# Patient Record
Sex: Female | Born: 1985 | Race: White | Hispanic: No | Marital: Married | Smoking: Former smoker
Health system: Southern US, Community
[De-identification: ages and names within clinical notes are randomized; demographics above are authoritative.]

## PROBLEM LIST (undated history)

## (undated) DIAGNOSIS — R569 Unspecified convulsions: Secondary | ICD-10-CM

## (undated) DIAGNOSIS — M1990 Unspecified osteoarthritis, unspecified site: Secondary | ICD-10-CM

## (undated) HISTORY — DX: Unspecified osteoarthritis, unspecified site: M19.90

## (undated) HISTORY — PX: APPENDECTOMY: SHX54

---

## 2010-04-06 ENCOUNTER — Ambulatory Visit (HOSPITAL_COMMUNITY): Payer: Self-pay | Admitting: Psychiatry

## 2010-05-12 ENCOUNTER — Ambulatory Visit (HOSPITAL_COMMUNITY)
Admission: RE | Admit: 2010-05-12 | Discharge: 2010-05-12 | Payer: Self-pay | Source: Home / Self Care | Attending: Psychiatry | Admitting: Psychiatry

## 2010-05-28 ENCOUNTER — Ambulatory Visit (HOSPITAL_COMMUNITY): Admit: 2010-05-28 | Payer: Self-pay | Admitting: Psychiatry

## 2010-05-28 ENCOUNTER — Encounter (INDEPENDENT_AMBULATORY_CARE_PROVIDER_SITE_OTHER): Payer: Medicaid Other | Admitting: Behavioral Health

## 2010-05-28 DIAGNOSIS — F411 Generalized anxiety disorder: Secondary | ICD-10-CM

## 2010-06-18 ENCOUNTER — Encounter (INDEPENDENT_AMBULATORY_CARE_PROVIDER_SITE_OTHER): Payer: Medicaid Other | Admitting: Behavioral Health

## 2010-06-18 DIAGNOSIS — F411 Generalized anxiety disorder: Secondary | ICD-10-CM

## 2010-07-02 ENCOUNTER — Encounter (HOSPITAL_COMMUNITY): Payer: Medicaid Other | Admitting: Behavioral Health

## 2010-07-13 ENCOUNTER — Encounter (HOSPITAL_COMMUNITY): Payer: Self-pay | Admitting: Behavioral Health

## 2010-07-23 ENCOUNTER — Encounter (HOSPITAL_COMMUNITY): Payer: Self-pay | Admitting: Behavioral Health

## 2010-11-17 ENCOUNTER — Emergency Department (HOSPITAL_COMMUNITY)
Admission: EM | Admit: 2010-11-17 | Discharge: 2010-11-18 | Disposition: A | Discharge status: Home / Self Care | Payer: Medicaid Other | Attending: Emergency Medicine | Admitting: Emergency Medicine

## 2010-11-17 ENCOUNTER — Emergency Department (HOSPITAL_COMMUNITY): Payer: Medicaid Other

## 2010-11-17 DIAGNOSIS — R51 Headache: Secondary | ICD-10-CM | POA: Insufficient documentation

## 2010-11-17 DIAGNOSIS — H53149 Visual discomfort, unspecified: Secondary | ICD-10-CM | POA: Insufficient documentation

## 2010-11-17 DIAGNOSIS — J45909 Unspecified asthma, uncomplicated: Secondary | ICD-10-CM | POA: Insufficient documentation

## 2011-06-12 ENCOUNTER — Emergency Department (HOSPITAL_COMMUNITY)
Admission: EM | Admit: 2011-06-12 | Discharge: 2011-06-12 | Disposition: A | Discharge status: Home / Self Care | Payer: Medicaid Other | Attending: Emergency Medicine | Admitting: Emergency Medicine

## 2011-06-12 ENCOUNTER — Encounter (HOSPITAL_COMMUNITY): Payer: Self-pay | Admitting: Emergency Medicine

## 2011-06-12 DIAGNOSIS — R404 Transient alteration of awareness: Secondary | ICD-10-CM | POA: Insufficient documentation

## 2011-06-12 DIAGNOSIS — F17200 Nicotine dependence, unspecified, uncomplicated: Secondary | ICD-10-CM | POA: Insufficient documentation

## 2011-06-12 DIAGNOSIS — Z79899 Other long term (current) drug therapy: Secondary | ICD-10-CM | POA: Insufficient documentation

## 2011-06-12 DIAGNOSIS — R55 Syncope and collapse: Secondary | ICD-10-CM | POA: Insufficient documentation

## 2011-06-12 DIAGNOSIS — R51 Headache: Secondary | ICD-10-CM | POA: Insufficient documentation

## 2011-06-12 HISTORY — DX: Unspecified convulsions: R56.9

## 2011-06-12 LAB — URINALYSIS, ROUTINE W REFLEX MICROSCOPIC
Bilirubin Urine: NEGATIVE
Ketones, ur: NEGATIVE mg/dL
Leukocytes, UA: NEGATIVE
Nitrite: NEGATIVE
Protein, ur: NEGATIVE mg/dL
Urobilinogen, UA: 0.2 mg/dL (ref 0.0–1.0)
pH: 7.5 (ref 5.0–8.0)

## 2011-06-12 LAB — CBC
MCH: 29.2 pg (ref 26.0–34.0)
MCHC: 33.7 g/dL (ref 30.0–36.0)
Platelets: 261 10*3/uL (ref 150–400)
RDW: 13 % (ref 11.5–15.5)

## 2011-06-12 LAB — POCT I-STAT, CHEM 8
Calcium, Ion: 1.2 mmol/L (ref 1.12–1.32)
HCT: 39 % (ref 36.0–46.0)
Hemoglobin: 13.3 g/dL (ref 12.0–15.0)
TCO2: 22 mmol/L (ref 0–100)

## 2011-06-12 LAB — ETHANOL: Alcohol, Ethyl (B): 53 mg/dL — ABNORMAL HIGH (ref 0–11)

## 2011-06-12 MED ORDER — METOCLOPRAMIDE HCL 5 MG/ML IJ SOLN
10.0000 mg | Freq: Once | INTRAMUSCULAR | Status: AC
  Administered 2011-06-12: 10 mg via INTRAVENOUS
  Filled 2011-06-12: qty 2

## 2011-06-12 MED ORDER — SODIUM CHLORIDE 0.9 % IV BOLUS (SEPSIS)
1000.0000 mL | Freq: Once | INTRAVENOUS | Status: AC
  Administered 2011-06-12: 1000 mL via INTRAVENOUS

## 2011-06-12 MED ORDER — DIPHENHYDRAMINE HCL 50 MG/ML IJ SOLN
12.5000 mg | Freq: Once | INTRAMUSCULAR | Status: AC
  Administered 2011-06-12: 12.5 mg via INTRAVENOUS
  Filled 2011-06-12: qty 1

## 2011-06-12 MED ORDER — POTASSIUM CHLORIDE CRYS ER 20 MEQ PO TBCR
40.0000 meq | EXTENDED_RELEASE_TABLET | Freq: Once | ORAL | Status: AC
  Administered 2011-06-12: 40 meq via ORAL
  Filled 2011-06-12: qty 2

## 2011-06-12 MED ORDER — DEXAMETHASONE SODIUM PHOSPHATE 4 MG/ML IJ SOLN
10.0000 mg | Freq: Once | INTRAMUSCULAR | Status: AC
  Administered 2011-06-12: 10 mg via INTRAVENOUS
  Filled 2011-06-12: qty 3

## 2011-06-12 NOTE — ED Notes (Signed)
Patient ambulated in hallway with steady gait.

## 2011-06-12 NOTE — Discharge Instructions (Signed)
Rest and be sure to drink plenty of fluids. Hold Topamax until you're able to be reevaluated by your physician.  Syncope  You have had a fainting (syncopal) spell. A fainting episode is a sudden, short-lived loss of consciousness. It results in complete recovery. It occurs because there has been a temporary shortage of oxygen and/or sugar (glucose) to the brain.  CAUSES  Blood pressure pills and other medications that may lower blood pressure below normal. Sudden changes in posture (sudden standing).  Over-medication. Take your medications as directed.  Standing too long. This can cause blood to pool in the legs.  Seizure disorders.  Low blood sugar (hypoglycemia) of diabetes. This more commonly causes coma.  Bearing down to go to the bathroom. This can cause your blood pressure to rise suddenly. Your body compensates by making the blood pressure too low when you stop bearing down.  Hardening of the arteries where the brain temporarily does not receive enough blood.  Irregular heart beat and circulatory problems.  Fear, emotional distress, injury, sight of blood, or illness.  Your caregiver will send you home if the syncope was from non-worrisome causes (benign). Depending on your age and health, you may stay to be monitored and observed. If you return home, have someone stay with you if your caregiver feels that is desirable.  It is very important to keep all follow-up referrals and appointments in order to properly manage this condition. This is a serious problem which can lead to serious illness and death if not carefully managed.  WARNING: Do not drive or operate machinery until your caregiver feels that it is safe for you to do so.  SEEK IMMEDIATE MEDICAL CARE IF:  You have another fainting episode or faint while lying or sitting down. DO NOT DRIVE YOURSELF. Call 911 if no other help is available.  You have chest pain, are feeling sick to your stomach (nausea), vomiting or abdominal pain.    You have an irregular heartbeat or one that is very fast (pulse over 120 beats per minute).  You have a loss of feeling in some part of your body or lose movement in your arms or legs.  You have difficulty with speech, confusion, severe weakness, or visual problems.  You become sweaty and/or feel light headed.  Make sure you are rechecked as instructed.

## 2011-06-12 NOTE — ED Provider Notes (Signed)
History     CSN: [id number]  Arrival date & time 06/12/11  0008   First MD Initiated Contact with Patient 06/12/11 0013      Chief Complaint  Patient presents with  . Loss of Consciousness    patient was at the bar with friends. she hat a shot of alcohol and she passed out there. EMS reported that boyfriend said that patient was out for a minute.     (Consider location/radiation/quality/duration/timing/severity/associated sxs/prior treatment) The history is provided by the patient.   syncopal event occurred prior to arrival. Patient was with her boyfriend at a bar and had been drinking when he noticed that she did not look right, and he caught her as she passed out. She did not hit the floor. He took her outside the club and 911 was called and she came to. Patient states she has a history of migraines seizures diagnosed sometime ago. She believes this was a typical episode for her. She denies any abdominal pain, vaginal bleeding or discharge. No Recent illness. She has not been getting a lot of sleep recently and did get up early this morning and is staying out late tonight. She typically does not drink alcohol. No chest pain or shortness of breath. No tongue biting or incontinence. Significant other denies any shaking or seizure activity otherwise. Patient admits to being under a lot of increased stress recently. She has also started a new medication Topamax, prescribed by Dr. Joycelyn Man. She currently denies feeling near syncopal. Moderate in severity. She does have a mild to moderate headache and is requesting something for what feels like a migraine. Pain all over and throbbing in quality.  Past Medical History  Diagnosis Date  . Seizures   . Migraine     History reviewed. No pertinent past surgical history.  History reviewed. No pertinent family history.  History  Substance Use Topics  . Smoking status: Current Everyday Smoker  . Smokeless tobacco: Not on file  . Alcohol Use: Yes      occasionally    OB History    Grav Para Term Preterm Abortions TAB SAB Ect Mult Living                  Review of Systems  Constitutional: Negative for fever and chills.  HENT: Negative for neck pain and neck stiffness.   Eyes: Negative for pain.  Respiratory: Negative for shortness of breath.   Cardiovascular: Negative for chest pain.  Gastrointestinal: Negative for abdominal pain.  Genitourinary: Negative for dysuria.  Musculoskeletal: Negative for back pain.  Skin: Negative for rash.  Neurological: Positive for headaches.  All other systems reviewed and are negative.    Allergies  Review of patient's allergies indicates no known allergies.  Home Medications   Current Outpatient Rx  Name Route Sig Dispense Refill  . METOPROLOL SUCCINATE ER 50 MG PO TB24 Oral Take 25 mg by mouth at bedtime. Take with or immediately following a meal.    . TIZANIDINE HCL 4 MG PO TABS Oral Take 2-4 mg by mouth at bedtime.    . TOPIRAMATE 25 MG PO TABS Oral Take 25 mg by mouth at bedtime.      BP 109/70  Pulse 70  Temp(Src) 98.6 F (37 C) (Oral)  Resp 15  Ht 5\' 7"  (1.702 m)  Wt 104 lb (47.174 kg)  BMI 16.29 kg/m2  SpO2 99%  LMP 06/08/2011  Physical Exam  Constitutional: She is oriented to person, place, and time. She  appears well-developed and well-nourished.  HENT:  Head: Normocephalic and atraumatic.       No tongue trauma  Eyes: Conjunctivae and EOM are normal. Pupils are equal, round, and reactive to light.  Neck: Full passive range of motion without pain. Neck supple. No thyromegaly present.       No meningismus  Cardiovascular: Normal rate, regular rhythm, S1 normal, S2 normal and intact distal pulses.   Pulmonary/Chest: Effort normal and breath sounds normal.  Abdominal: Soft. Bowel sounds are normal. There is no tenderness. There is no CVA tenderness.  Musculoskeletal: Normal range of motion.  Neurological: She is alert and oriented to person, place, and time. She  has normal strength and normal reflexes. No cranial nerve deficit or sensory deficit. She displays a negative Romberg sign. GCS eye subscore is 4. GCS verbal subscore is 5. GCS motor subscore is 6.       Normal Gait  Skin: Skin is warm and dry. No rash noted. No cyanosis. Nails show no clubbing.  Psychiatric: She has a normal mood and affect. Her speech is normal and behavior is normal.    ED Course  Procedures (including critical care time)  Labs Reviewed  URINALYSIS, ROUTINE W REFLEX MICROSCOPIC - Abnormal; Notable for the following:    Color, Urine STRAW (*)    Specific Gravity, Urine 1.004 (*)    All other components within normal limits  ETHANOL - Abnormal; Notable for the following:    Alcohol, Ethyl (B) 53 (*)    All other components within normal limits  POCT I-STAT, CHEM 8 - Abnormal; Notable for the following:    Potassium 3.3 (*)    Glucose, Bld 100 (*)    All other components within normal limits  PREGNANCY, URINE  CBC     Date: 06/12/2011  Rate: 71  Rhythm: normal sinus rhythm  QRS Axis: normal  Intervals: normal  ST/T Wave abnormalities: nonspecific ST/T changes  Conduction Disutrbances:none  Narrative Interpretation:   Old EKG Reviewed: none available   MDM   Syncope versus seizure. Workup as above. Potassium given for hypokalemia. No bradycardia or hypotension to suggest adverse medication reaction from Topamax. No obvious seizure activity reported. Patient given IV fluids and observed. On recheck at 2:40 AM is feeling much better and requesting to go home. Headache improving. Repeat exam remains unchanged no neuro deficits. Plan close primary care followup for review of medications and followup as an outpatient. Reliable historian states understanding strict return precautions.        Sunnie Nielsen, MD 06/12/11 [phone]

## 2012-05-01 IMAGING — CT CT HEAD W/O CM
1 of 2 series · 13 of 30 positions shown, 17 images · non-contrast
Comparison: None.

CLINICAL DATA: Headaches

CT HEAD WITHOUT CONTRAST
TECHNIQUE: Contiguous axial images were obtained from the base of
the skull through the vertex without contrast.

[Series 2: brain · axial · 0.49mm/px · z∈[+135,+266]mm · 13 of 32 slices shown, 17 images]
[im 3/32  brain]
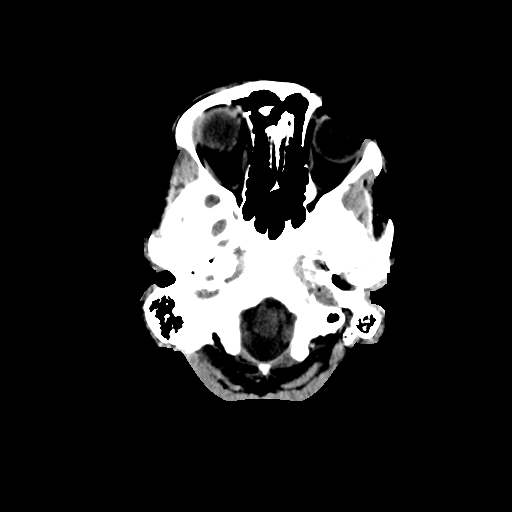
[im 3/32  bone]
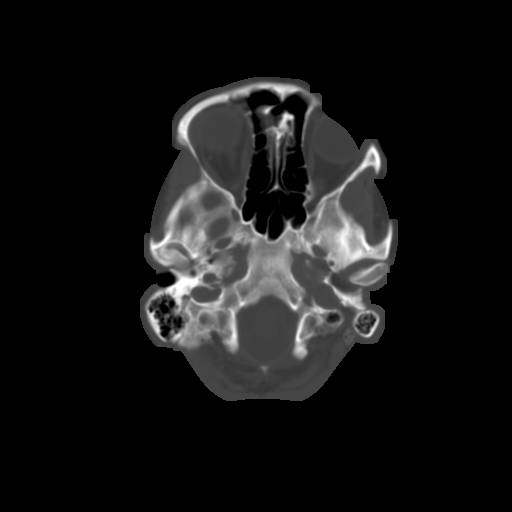
[im 5/32  brain]
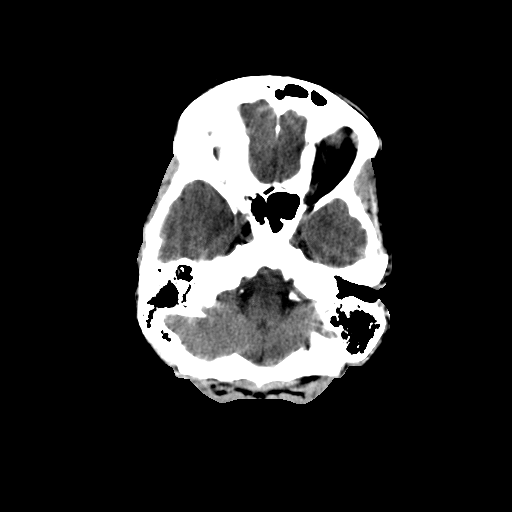
[im 7/32  brain]
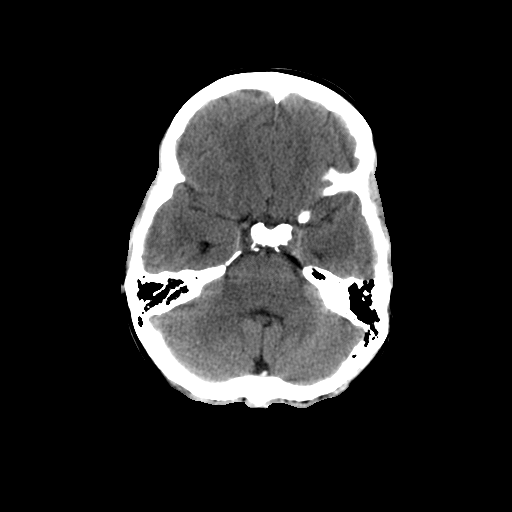
[im 9/32  brain]
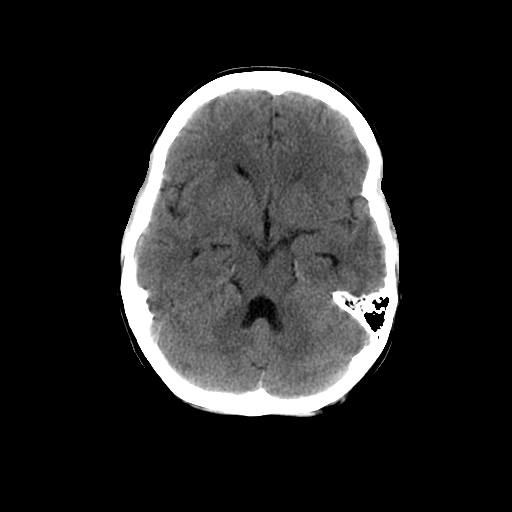
[im 12/32  brain]
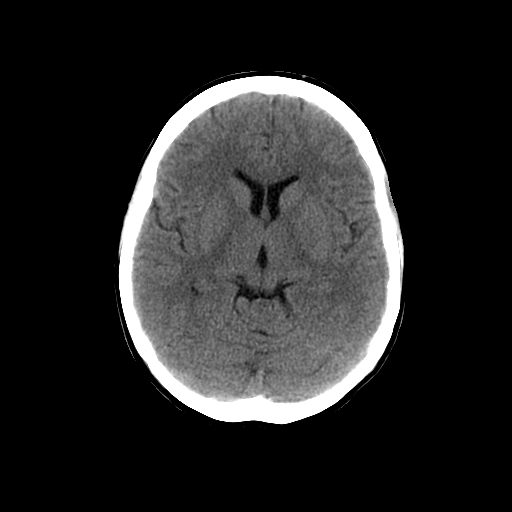
[im 12/32  bone]
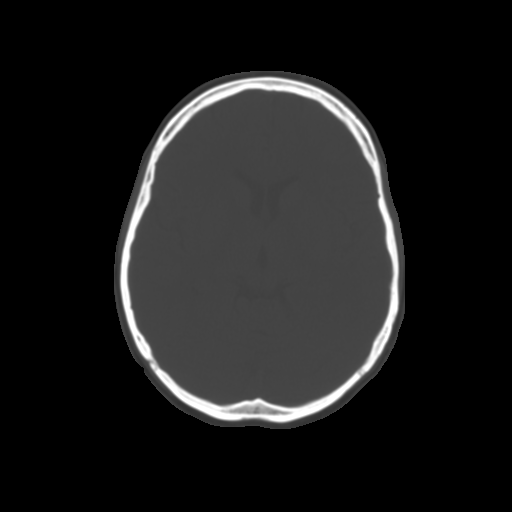
[im 14/32  brain]
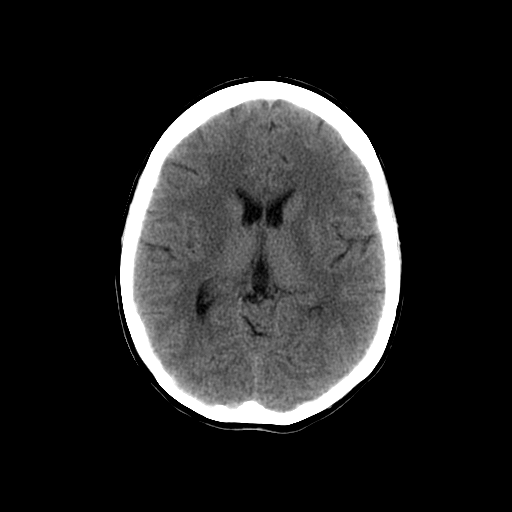
[im 16/32  brain]
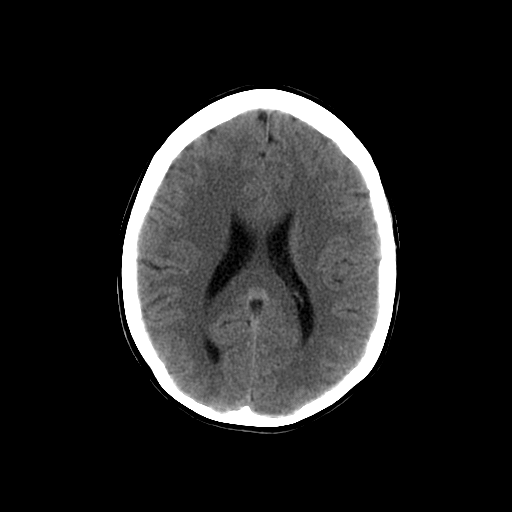
[im 18/32  brain]
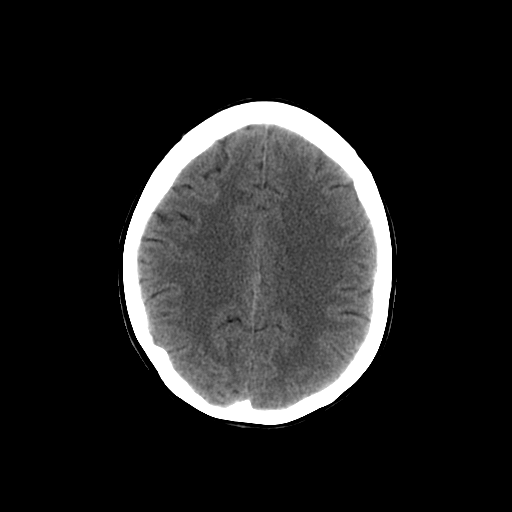
[im 20/32  brain]
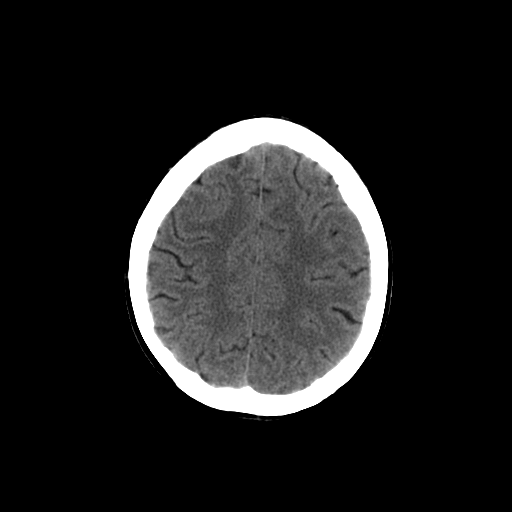
[im 20/32  bone]
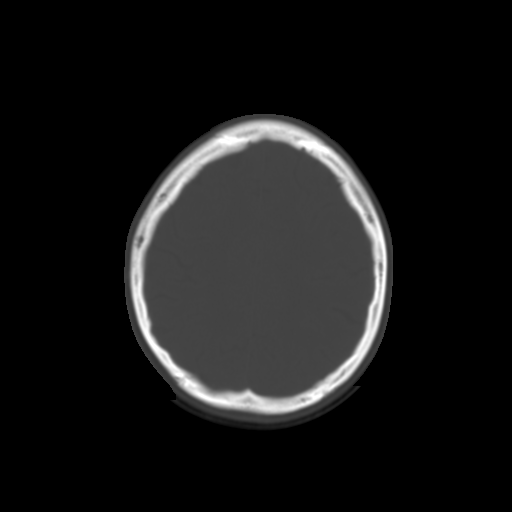
[im 23/32  brain]
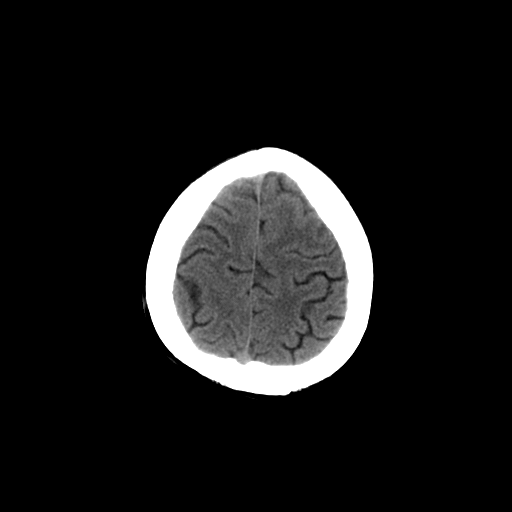
[im 25/32  brain]
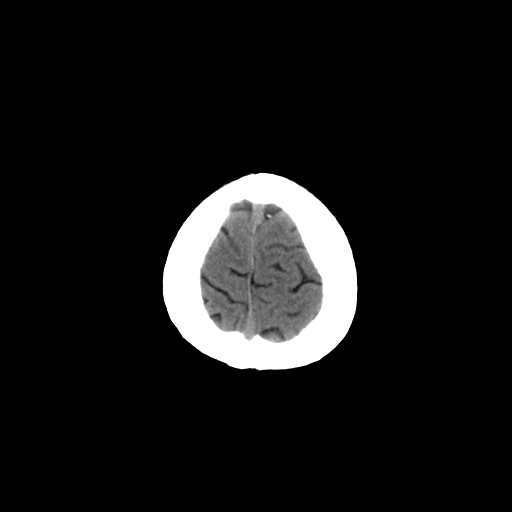
[im 27/32  brain]
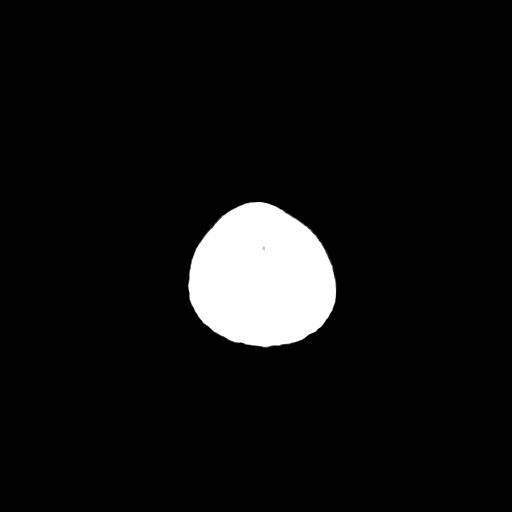
[im 29/32  brain]
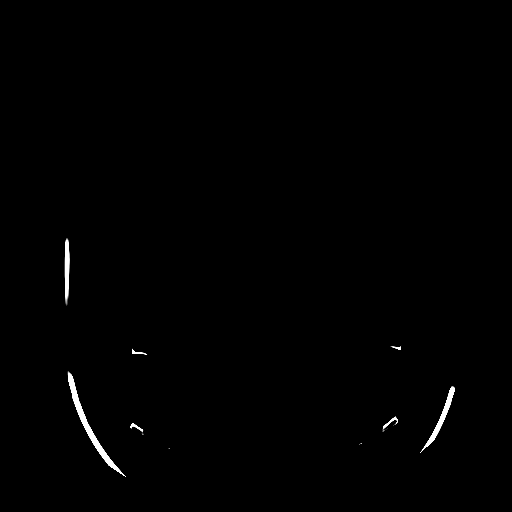
[im 29/32  bone]
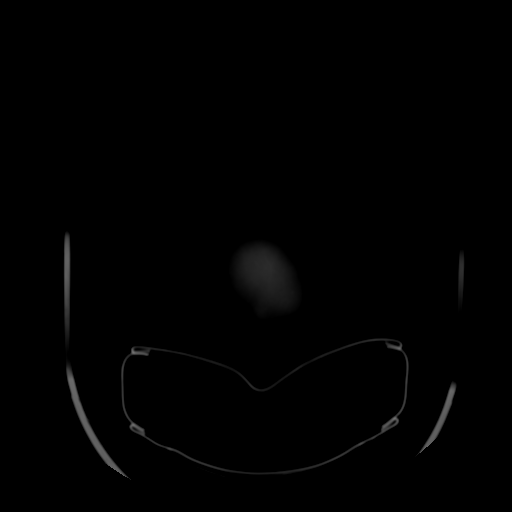

[13 of 30 positions shown; findings below may reference images not displayed]

FINDINGS: There is no evidence of acute intracranial hemorrhage,
brain edema, mass lesion, acute infarction,   mass effect, or
midline shift. Acute infarct may be inapparent on noncontrast CT.
No other intra-axial abnormalities are seen, and the ventricles and
sulci are within normal limits in size and symmetry.   No abnormal
extra-axial fluid collections or masses are identified.  No
significant calvarial abnormality.
IMPRESSION: 1. Negative for bleed or other acute intracranial process.

## 2014-04-26 HISTORY — PX: TUBAL LIGATION: SHX77

## 2016-09-19 ENCOUNTER — Encounter (HOSPITAL_COMMUNITY): Payer: Self-pay

## 2016-09-19 ENCOUNTER — Emergency Department (HOSPITAL_COMMUNITY)
Admission: EM | Admit: 2016-09-19 | Discharge: 2016-09-19 | Disposition: A | Discharge status: Home / Self Care | Payer: Medicaid Other | Attending: Emergency Medicine | Admitting: Emergency Medicine

## 2016-09-19 DIAGNOSIS — R55 Syncope and collapse: Secondary | ICD-10-CM | POA: Diagnosis present

## 2016-09-19 DIAGNOSIS — F17200 Nicotine dependence, unspecified, uncomplicated: Secondary | ICD-10-CM | POA: Diagnosis not present

## 2016-09-19 DIAGNOSIS — Z79899 Other long term (current) drug therapy: Secondary | ICD-10-CM | POA: Diagnosis not present

## 2016-09-19 LAB — I-STAT CHEM 8, ED
BUN: 5 mg/dL — ABNORMAL LOW (ref 6–20)
CALCIUM ION: 1.21 mmol/L (ref 1.15–1.40)
Chloride: 102 mmol/L (ref 101–111)
Creatinine, Ser: 0.7 mg/dL (ref 0.44–1.00)
Glucose, Bld: 125 mg/dL — ABNORMAL HIGH (ref 65–99)
HEMATOCRIT: 27 % — AB (ref 36.0–46.0)
HEMOGLOBIN: 9.2 g/dL — AB (ref 12.0–15.0)
Potassium: 3.3 mmol/L — ABNORMAL LOW (ref 3.5–5.1)
Sodium: 141 mmol/L (ref 135–145)
TCO2: 27 mmol/L (ref 0–100)

## 2016-09-19 LAB — I-STAT BETA HCG BLOOD, ED (MC, WL, AP ONLY)

## 2016-09-19 NOTE — ED Triage Notes (Signed)
She became near-syncopal while at a local restaurant/bar. Her boy friend noticed her about to pass out, when he caught her up in his arms, thereby preventing her from falling. EMS found her to be awake, alert and oriented. Uopn them standing pt. For orthostatics, she told them she again felt "like passing out". EKG performed by EMS [facility].

## 2016-09-19 NOTE — ED Notes (Signed)
Bed: WA14 Expected date:  Expected time:  Means of arrival:  Comments: Syncopal episode 

## 2016-09-19 NOTE — ED Provider Notes (Signed)
[facility]-EMERGENCY DEPT Provider Note   CSN: [id number] Arrival date & time: 09/19/16  1843     History   Chief Complaint Chief Complaint  Patient presents with  . Near Syncopal    HPI Virginia Freeman is a 31 y.o. female.  The history is provided by the patient and a friend.  Near Syncope  This is a recurrent problem. The current episode started 1 to 2 hours ago. Episode frequency: once. The problem has been resolved. Pertinent negatives include no chest pain, no abdominal pain, no headaches and no shortness of breath. Associated symptoms comments: Left lower back/flank pain. Nothing aggravates the symptoms. Nothing relieves the symptoms. She has tried nothing for the symptoms.   Being treated for pyelo with Cipro and Zofran.  Episode occurred while playing Biliary disease. She reports that she noted left lower back pain after leaning forward to take a shot and then standing back up. Pain was severe. Husband reports that during this episode and she complained of severe lower extremity pain and was unable to move her legs at that time. She did not pass out however she looked very flushed and pale. This episode lasted for approximately 1-2 minutes. Patient returned to baseline and her symptoms have since resolved.  Of note patient had a recent laparoscopic appendectomy 3 weeks ago. Her recovery has been going well without any significant comfort locations. Patient's reporting active bowel movements and passing gas. Denies any abdominal pain. Past Medical History:  Diagnosis Date  . Migraine   . Seizures (HCC)     There are no active problems to display for this patient.   Past Surgical History:  Procedure Laterality Date  . APPENDECTOMY      OB History    No data available       Home Medications    Prior to Admission medications   Medication Sig Start Date End Date Taking? Authorizing Provider  acetaminophen (TYLENOL) 500 MG tablet Take 1,500 mg by mouth every 6 (six)  hours as needed.   Yes [provider]  baclofen (LIORESAL) 10 MG tablet Take 10 mg by mouth daily as needed for headache. 07/08/16 07/08/17 Yes [provider]  ciprofloxacin (CIPRO) 500 MG tablet Take 500 mg by mouth 2 (two) times daily. 7 day course started 5/26 09/17/16 09/27/16 Yes [provider]  gabapentin (NEURONTIN) 100 MG capsule Take 300 mg by mouth at bedtime. 07/27/16 07/27/17 Yes [provider]  HYDROcodone-acetaminophen (NORCO/VICODIN) 5-325 MG tablet Take 1 tablet by mouth every 6 (six) hours as needed for pain. 09/17/16  Yes [provider]  nabumetone (RELAFEN) 750 MG tablet Take 750 mg by mouth 2 (two) times daily. 08/25/16  Yes [provider]  ondansetron (ZOFRAN) 4 MG tablet Take 4 mg by mouth every 8 (eight) hours as needed for nausea.   Yes [provider]  Polyethylene Glycol 3350-GRX POWD Take 17 g by mouth daily.   Yes [provider]    Family History No family history on file.  Social History Social History  Substance Use Topics  . Smoking status: Current Every Day Smoker  . Smokeless tobacco: Not on file  . Alcohol use Yes     Comment: occasionally     Allergies   Patient has no known allergies.   Review of Systems Review of Systems  Respiratory: Negative for shortness of breath.   Cardiovascular: Positive for near-syncope. Negative for chest pain.  Gastrointestinal: Negative for abdominal pain.  Neurological: Negative for headaches.  All other systems are reviewed and are negative for acute change except as noted in the HPI   Physical Exam Updated Vital Signs BP (!) 132/92 (BP Location: Left Arm)   Pulse 66   Temp 97.9 F (36.6 C) (Oral)   Resp 16   LMP 09/05/2016 (Approximate)   SpO2 100%   Physical Exam  Constitutional: She is oriented to person, place, and time. She appears well-developed and well-nourished. No distress.  HENT:  Head: Normocephalic and atraumatic.  Nose:  Nose normal.  Eyes: Conjunctivae and EOM are normal. Pupils are equal, round, and reactive to light. Right eye exhibits no discharge. Left eye exhibits no discharge. No scleral icterus.  Neck: Normal range of motion. Neck supple.  Cardiovascular: Normal rate and regular rhythm.  Exam reveals no gallop and no friction rub.   No murmur heard. Pulmonary/Chest: Effort normal and breath sounds normal. No stridor. No respiratory distress. She has no rales.  Abdominal: Soft. She exhibits no distension. There is no tenderness. There is no rigidity, no rebound and no guarding.  Trochar sites clean and intact  Musculoskeletal: She exhibits no edema.       Lumbar back: She exhibits tenderness. She exhibits no bony tenderness.       Back:  Neurological: She is alert and oriented to person, place, and time.  Spine Exam:  Strength: 5/5 throughout LE bilaterally (hip flexion/extension, adduction/abduction; knee flexion/extension; foot dorsiflexion/plantarflexion, inversion/eversion; great toe inversion) Sensation: Intact to light touch in proximal and distal LE bilaterally Reflexes: 2+ quadriceps and achilles reflexes    Skin: Skin is warm and dry. No rash noted. She is not diaphoretic. No erythema.  Psychiatric: She has a normal mood and affect.  Vitals reviewed.    ED Treatments / Results  Labs (all labs ordered are listed, but only abnormal results are displayed) Labs Reviewed  I-STAT CHEM 8, ED - Abnormal; Notable for the following:       Result Value   Potassium 3.3 (*)    BUN 5 (*)    Glucose, Bld 125 (*)    Hemoglobin 9.2 (*)    HCT 27.0 (*)    All other components within normal limits  I-STAT BETA HCG BLOOD, ED ([facility], [facility], [facility] ONLY)    EKG  EKG Interpretation  Date/Time:  Sunday Sep 19 2016 20:50:52 EDT Ventricular Rate:  63 PR Interval:    QRS Duration: 94 QT Interval:  431 QTC Calculation: 442 R Axis:   76 Text Interpretation:  Sinus rhythm Borderline repolarization  abnormality No STEMI QT normal Confirmed by [facility] MD, PEDRO ([id number]) on 09/19/2016 9:06:54 PM       Radiology No results found.  Procedures Procedures (including critical care time)  Medications Ordered in ED Medications - No data to display   Initial Impression / Assessment and Plan / ED Course  I have reviewed the triage vital signs and the nursing notes.  Pertinent labs & imaging results that were available during my care of the patient were reviewed by me and considered in my medical decision making (see chart for details).     Chronic lower back pain. No evidence of cauda equina.   Near syncopal episode likely secondary to vasovagal syndrome due to the severity of the pain. However patient is currently taking ciprofloxacin and Zofran for pyelonephritis which can affect her QT prolongation. EKG obtained which revealed normal QT interval. No evidence of Brugada or other dysrhythmias noted. Beta hCG is negative so doubt possible ectopic pregnancy.  Labs grossly reassuring. Hemoglobin was noted to be on the low end at 9.2 however this is likely secondary to her recent surgery. Patient is denying any recent vaginal bleeding, hematochezia, or melena.  The patient is safe for discharge with strict return precautions.  Final Clinical Impressions(s) / ED Diagnoses   Final diagnoses:  Near syncope   Disposition: Discharge  Condition: Good  I have discussed the results, Dx and Tx plan with the patient who expressed understanding and agree(s) with the plan. Discharge instructions discussed at great length. The patient was given strict return precautions who verbalized understanding of the instructions. No further questions at time of discharge.    New Prescriptions   No medications on file    Follow Up: Cristy HiltsCampbell, Ashley M, NP [address] [place] [address] [phone]  Schedule an appointment as soon as possible for a visit  As needed      Nira Connardama, Pedro  Eduardo, MD 09/19/16 2254

## 2017-04-26 HISTORY — PX: ABLATION: SHX5711

## 2017-06-30 ENCOUNTER — Ambulatory Visit (HOSPITAL_COMMUNITY): Payer: Self-pay | Admitting: Psychiatry

## 2018-08-11 ENCOUNTER — Emergency Department (HOSPITAL_BASED_OUTPATIENT_CLINIC_OR_DEPARTMENT_OTHER)
Admission: EM | Admit: 2018-08-11 | Discharge: 2018-08-11 | Disposition: A | Discharge status: Home / Self Care | Payer: Medicaid Other | Attending: Emergency Medicine | Admitting: Emergency Medicine

## 2018-08-11 ENCOUNTER — Other Ambulatory Visit: Payer: Self-pay

## 2018-08-11 ENCOUNTER — Emergency Department (HOSPITAL_BASED_OUTPATIENT_CLINIC_OR_DEPARTMENT_OTHER): Payer: Medicaid Other

## 2018-08-11 ENCOUNTER — Encounter (HOSPITAL_BASED_OUTPATIENT_CLINIC_OR_DEPARTMENT_OTHER): Payer: Self-pay | Admitting: *Deleted

## 2018-08-11 DIAGNOSIS — Z79899 Other long term (current) drug therapy: Secondary | ICD-10-CM | POA: Diagnosis not present

## 2018-08-11 DIAGNOSIS — F17200 Nicotine dependence, unspecified, uncomplicated: Secondary | ICD-10-CM | POA: Diagnosis not present

## 2018-08-11 DIAGNOSIS — R0789 Other chest pain: Secondary | ICD-10-CM | POA: Diagnosis not present

## 2018-08-11 DIAGNOSIS — R079 Chest pain, unspecified: Secondary | ICD-10-CM | POA: Diagnosis present

## 2018-08-11 LAB — CBC WITH DIFFERENTIAL/PLATELET
Abs Immature Granulocytes: 0 10*3/uL (ref 0.00–0.07)
Basophils Absolute: 0 10*3/uL (ref 0.0–0.1)
Basophils Relative: 1 %
Eosinophils Absolute: 0.1 10*3/uL (ref 0.0–0.5)
Eosinophils Relative: 2 %
HCT: 37.1 % (ref 36.0–46.0)
Hemoglobin: 12.6 g/dL (ref 12.0–15.0)
Immature Granulocytes: 0 %
Lymphocytes Relative: 33 %
Lymphs Abs: 1.7 10*3/uL (ref 0.7–4.0)
MCH: 30.4 pg (ref 26.0–34.0)
MCHC: 34 g/dL (ref 30.0–36.0)
MCV: 89.6 fL (ref 80.0–100.0)
Monocytes Absolute: 0.5 10*3/uL (ref 0.1–1.0)
Monocytes Relative: 9 %
Neutro Abs: 2.9 10*3/uL (ref 1.7–7.7)
Neutrophils Relative %: 55 %
Platelets: 185 10*3/uL (ref 150–400)
RBC: 4.14 MIL/uL (ref 3.87–5.11)
RDW: 12 % (ref 11.5–15.5)
WBC: 5.2 10*3/uL (ref 4.0–10.5)
nRBC: 0 % (ref 0.0–0.2)

## 2018-08-11 LAB — COMPREHENSIVE METABOLIC PANEL
ALT: 11 U/L (ref 0–44)
AST: 14 U/L — ABNORMAL LOW (ref 15–41)
Albumin: 4.1 g/dL (ref 3.5–5.0)
Alkaline Phosphatase: 48 U/L (ref 38–126)
Anion gap: 5 (ref 5–15)
BUN: 11 mg/dL (ref 6–20)
CO2: 26 mmol/L (ref 22–32)
Calcium: 8.9 mg/dL (ref 8.9–10.3)
Chloride: 105 mmol/L (ref 98–111)
Creatinine, Ser: 0.73 mg/dL (ref 0.44–1.00)
GFR calc Af Amer: >60 mL/min (ref >60)
GFR calc non Af Amer: >60 mL/min (ref >60)
Glucose, Bld: 104 mg/dL — ABNORMAL HIGH (ref 70–99)
Potassium: 3.7 mmol/L (ref 3.5–5.1)
Sodium: 136 mmol/L (ref 135–145)
Total Bilirubin: 0.9 mg/dL (ref 0.3–1.2)
Total Protein: 7.1 g/dL (ref 6.5–8.1)

## 2018-08-11 LAB — TSH: TSH: 5.345 u[IU]/mL — ABNORMAL HIGH (ref 0.350–4.500)

## 2018-08-11 LAB — D-DIMER, QUANTITATIVE: D-Dimer, Quant: 0.31 ug/mL-FEU (ref 0.00–0.50)

## 2018-08-11 LAB — TROPONIN I: Troponin I: <0.03 ng/mL (ref <0.03)

## 2018-08-11 NOTE — ED Notes (Signed)
Pt understood dc material. NAD noted. All questions answered to satisfaction. Pt escorted to check out window 

## 2018-08-11 NOTE — ED Provider Notes (Signed)
[facility] EMERGENCY DEPARTMENT Provider Note   CSN: [id number] Arrival date & time: 08/11/18  1808    History   Chief Complaint Chief Complaint  Patient presents with  . Chest Pain    HPI Genia HaroldConstance Koepke is a 33 y.o. female history of migraines here presenting with chest pain, shortness of breath.  Patient states that she has some chest pressure for the last several weeks.  She states that it is worse at night and she feels that her heart is skipping beats.  She has some subjective shortness of breath as well.  She states that she gets very anxious at night and felt like she could not breathe so was unable to sleep for several weeks.  Patient states that she felt anxious but denies any hallucinations or thoughts of harming herself or others.  Patient told me that she has a heart murmur previously but never required any surgery.  She has no history of atrial fibrillation.  Patient also has some intermittent headaches and call her neurologist yesterday was thought to have migraines.  Patient called primary care doctor today and had a telemetry message and came here for further evaluation. Denies any recent travel or sick contacts.      The history is provided by the patient.    Past Medical History:  Diagnosis Date  . Migraine   . Seizures (HCC)     There are no active problems to display for this patient.   Past Surgical History:  Procedure Laterality Date  . APPENDECTOMY       OB History   No obstetric history on file.      Home Medications    Prior to Admission medications   Medication Sig Start Date End Date Taking? Authorizing Provider  Celecoxib (CELEBREX PO) Take by mouth.   Yes [provider]  gabapentin (NEURONTIN) 100 MG capsule Take 300 mg by mouth at bedtime. 07/27/16 08/11/18 Yes [provider]  acetaminophen (TYLENOL) 500 MG tablet Take 1,500 mg by mouth every 6 (six) hours as needed.    [provider]   HYDROcodone-acetaminophen (NORCO/VICODIN) 5-325 MG tablet Take 1 tablet by mouth every 6 (six) hours as needed for pain. 09/17/16   [provider]  nabumetone (RELAFEN) 750 MG tablet Take 750 mg by mouth 2 (two) times daily. 08/25/16   [provider]  ondansetron (ZOFRAN) 4 MG tablet Take 4 mg by mouth every 8 (eight) hours as needed for nausea.    [provider]  Polyethylene Glycol 3350-GRX POWD Take 17 g by mouth daily.    [provider]    Family History No family history on file.  Social History Social History   Tobacco Use  . Smoking status: Current Every Day Smoker  . Smokeless tobacco: Never Used  Substance Use Topics  . Alcohol use: Yes    Comment: occasionally  . Drug use: No     Allergies   Patient has no known allergies.   Review of Systems Review of Systems  Respiratory: Positive for shortness of breath.   Cardiovascular: Positive for chest pain.  All other systems reviewed and are negative.    Physical Exam Updated Vital Signs BP (!) 123/94   Pulse 85   Temp 98.2 F (36.8 C) (Oral)   Resp 16   Ht 5\' 7"  (1.702 m)   Wt 47.6 kg   SpO2 100%   BMI 16.45 kg/m   Physical Exam Vitals signs reviewed.  Constitutional:  Comments: Anxious   HENT:     Head: Normocephalic.  Eyes:     Pupils: Pupils are equal, round, and reactive to light.  Neck:     Musculoskeletal: Normal range of motion and neck supple.  Cardiovascular:     Rate and Rhythm: Normal rate and regular rhythm.     Heart sounds: Normal heart sounds.     Comments: No obvious heart murmurs  Pulmonary:     Effort: Pulmonary effort is normal.  Abdominal:     General: Bowel sounds are normal.     Palpations: Abdomen is soft.  Musculoskeletal: Normal range of motion.     Right lower leg: She exhibits no tenderness. No edema.     Left lower leg: She exhibits no tenderness. No edema.  Skin:    General: Skin is warm.     Capillary Refill: Capillary  refill takes less than 2 seconds.  Neurological:     General: No focal deficit present.     Mental Status: She is alert and oriented to person, place, and time.  Psychiatric:        Mood and Affect: Mood normal.        Behavior: Behavior normal.      ED Treatments / Results  Labs (all labs ordered are listed, but only abnormal results are displayed) Labs Reviewed  CBC WITH DIFFERENTIAL/PLATELET  COMPREHENSIVE METABOLIC PANEL  TROPONIN I  D-DIMER, QUANTITATIVE (NOT AT [facility])  TSH  URINALYSIS, ROUTINE W REFLEX MICROSCOPIC  PREGNANCY, URINE    EKG EKG Interpretation  Date/Time:  Friday August 11 2018 18:43:06 EDT Ventricular Rate:  80 PR Interval:    QRS Duration: 82 QT Interval:  382 QTC Calculation: 441 R Axis:   67 Text Interpretation:  Sinus rhythm Borderline short PR interval Borderline repolarization abnormality No significant change since last tracing Confirmed by Richardean Canal [phone]) on 08/11/2018 6:55:07 PM   Radiology No results found.  Procedures Procedures (including critical care time)  Medications Ordered in ED Medications - No data to display   Initial Impression / Assessment and Plan / ED Course  I have reviewed the triage vital signs and the nursing notes.  Pertinent labs & imaging results that were available during my care of the patient were reviewed by me and considered in my medical decision making (see chart for details).       Mike Royalty is a 33 y.o. female here with SOB, palpitations. Likely anxiety. Patient's EKG showed no obvious arrhythmias or ST changes. She is low risk for PE so will get d-dimer. Will check labs, CXR. No COVID contacts.   8:21 PM D-dimer negative. Trop neg and symptoms for several weeks. TSH pending. I think she is stable for discharge and she can see her TSH on mychart. I think likely anxiety.    Final Clinical Impressions(s) / ED Diagnoses   Final diagnoses:  None    ED Discharge Orders    None        Charlynne Pander, MD 08/11/18 2022

## 2018-08-11 NOTE — ED Triage Notes (Signed)
Hx of heart murmur. States last night she had pressure in her chest, nausea and felt like she could not breathe. She felt anxious.

## 2018-08-11 NOTE — Discharge Instructions (Signed)
Take tylenol, motrin for pain   We sent off thyroid function test. You can view it on Mychart.   See your doctor   Return to ER if you have worse chest pain, shortness of breath, fever

## 2020-01-24 IMAGING — CR CHEST - 2 VIEW
2 series · 2 of 2 positions shown · non-contrast
Comparison: None.

CLINICAL DATA: Chest pain/pressure and nausea.

EXAM:
CHEST - 2 VIEW

[w chest pa *]
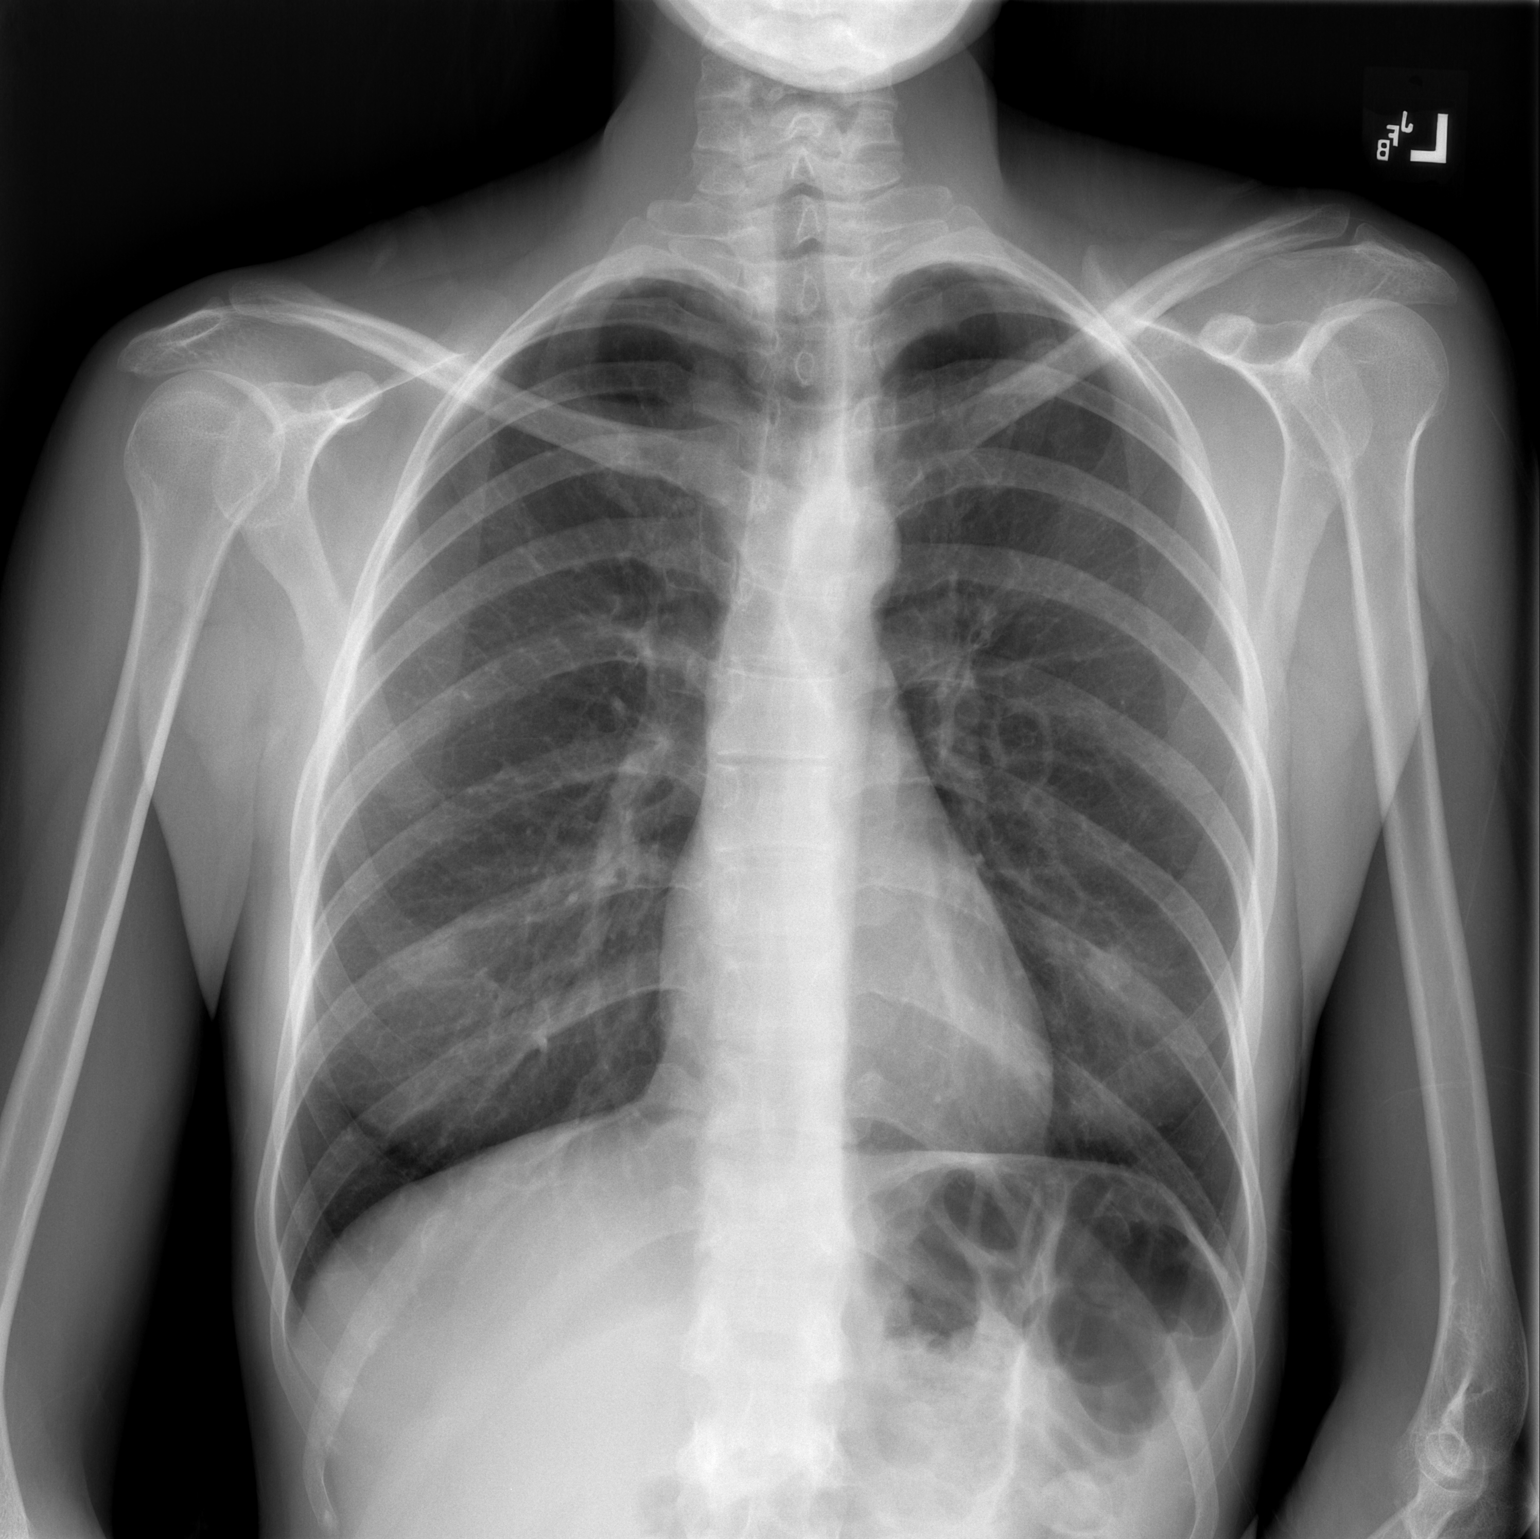

[w chest lat]
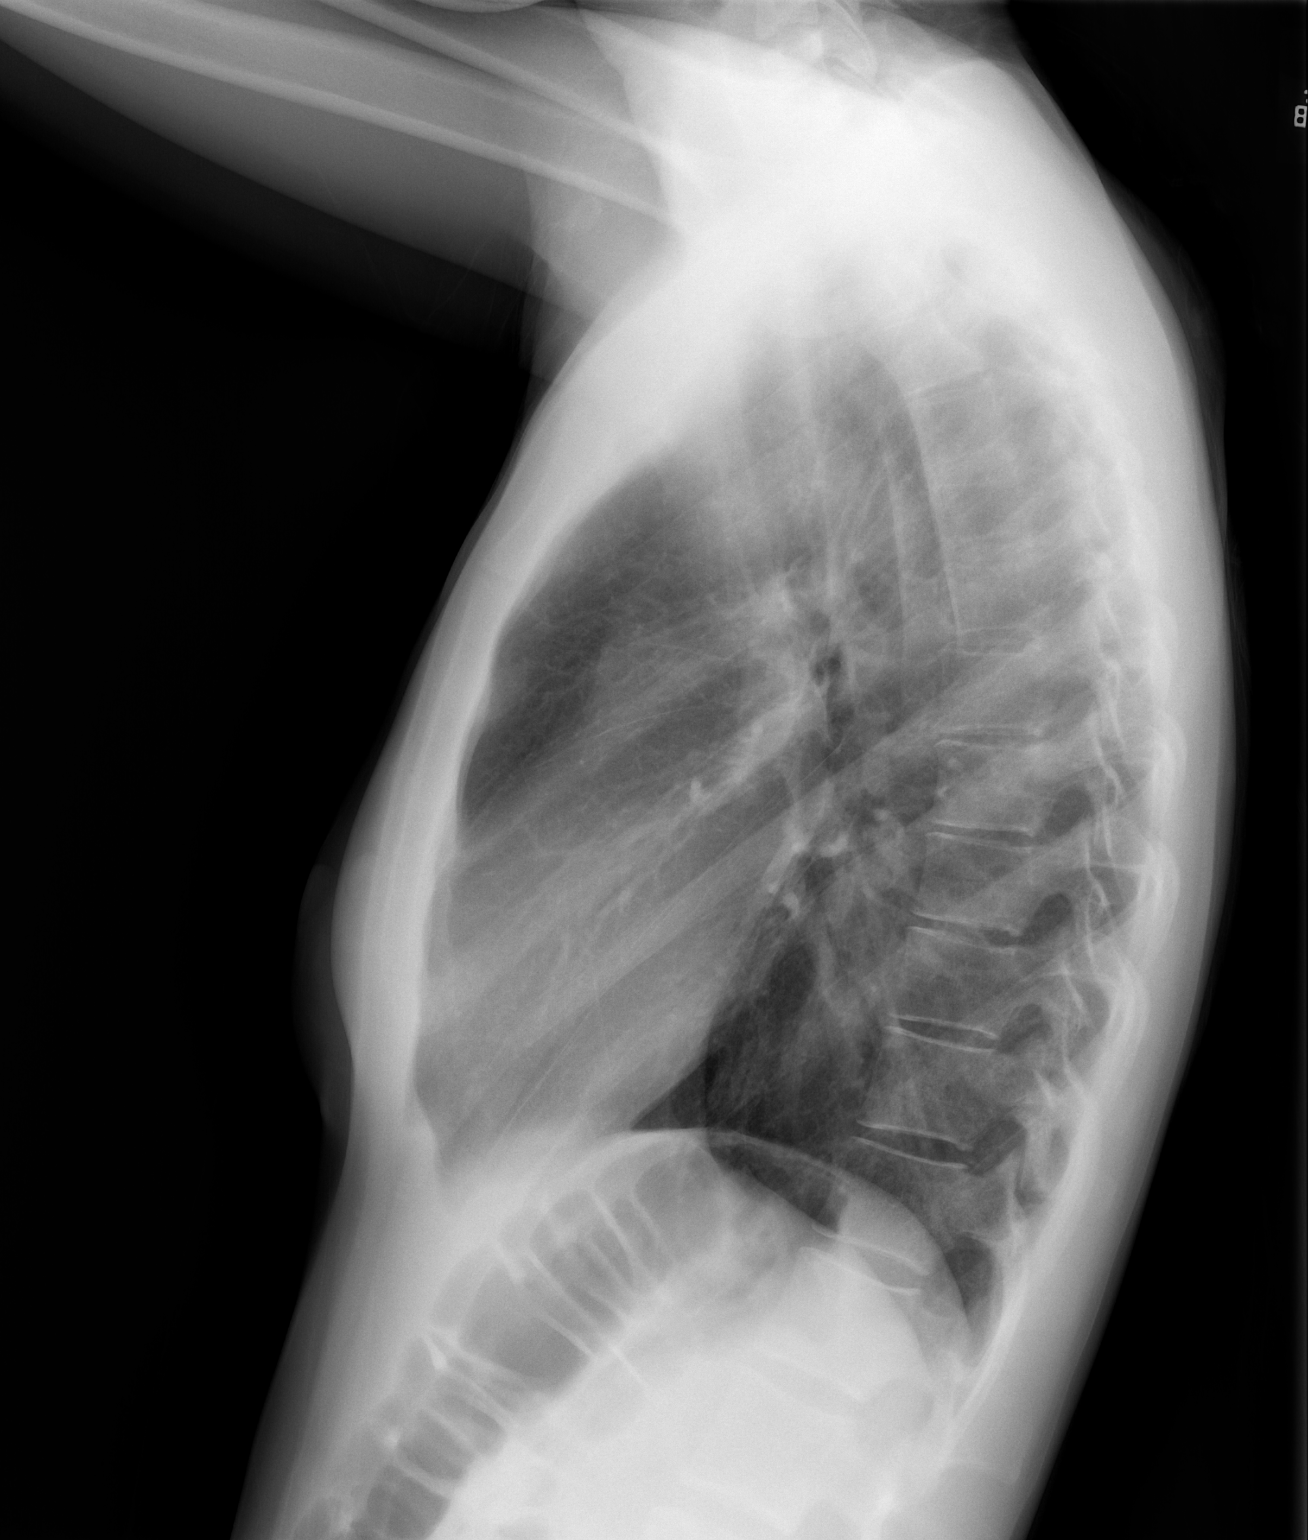

[2 of 2 positions shown; findings below may reference images not displayed]

FINDINGS: The cardiomediastinal silhouette is within normal limits. The lungs
are well inflated. No airspace consolidation, edema, pleural
effusion, pneumothorax is identified. Nipple shadows are noted
bilaterally. No acute osseous abnormality is seen.
IMPRESSION: No active cardiopulmonary disease.

## 2022-03-28 ENCOUNTER — Other Ambulatory Visit: Payer: Self-pay

## 2022-03-28 ENCOUNTER — Emergency Department (HOSPITAL_BASED_OUTPATIENT_CLINIC_OR_DEPARTMENT_OTHER)
Admission: EM | Admit: 2022-03-28 | Discharge: 2022-03-29 | Discharge status: Against Medical Advice | Payer: Medicaid Other | Attending: Emergency Medicine | Admitting: Emergency Medicine

## 2022-03-28 ENCOUNTER — Encounter (HOSPITAL_BASED_OUTPATIENT_CLINIC_OR_DEPARTMENT_OTHER): Payer: Self-pay | Admitting: Emergency Medicine

## 2022-03-28 DIAGNOSIS — Z5321 Procedure and treatment not carried out due to patient leaving prior to being seen by health care provider: Secondary | ICD-10-CM | POA: Insufficient documentation

## 2022-03-28 DIAGNOSIS — R109 Unspecified abdominal pain: Secondary | ICD-10-CM | POA: Diagnosis not present

## 2022-03-28 DIAGNOSIS — K920 Hematemesis: Secondary | ICD-10-CM | POA: Insufficient documentation

## 2022-03-28 LAB — COMPREHENSIVE METABOLIC PANEL
ALT: 8 U/L (ref 0–44)
AST: 12 U/L — ABNORMAL LOW (ref 15–41)
Albumin: 4.7 g/dL (ref 3.5–5.0)
Alkaline Phosphatase: 40 U/L (ref 38–126)
Anion gap: 9 (ref 5–15)
BUN: 11 mg/dL (ref 6–20)
CO2: 25 mmol/L (ref 22–32)
Calcium: 8.8 mg/dL — ABNORMAL LOW (ref 8.9–10.3)
Chloride: 106 mmol/L (ref 98–111)
Creatinine, Ser: 0.72 mg/dL (ref 0.44–1.00)
GFR, Estimated: >60 mL/min (ref >60)
Glucose, Bld: 103 mg/dL — ABNORMAL HIGH (ref 70–99)
Potassium: 4.3 mmol/L (ref 3.5–5.1)
Sodium: 140 mmol/L (ref 135–145)
Total Bilirubin: 0.6 mg/dL (ref 0.3–1.2)
Total Protein: 7.5 g/dL (ref 6.5–8.1)

## 2022-03-28 LAB — URINALYSIS, ROUTINE W REFLEX MICROSCOPIC
Bilirubin Urine: NEGATIVE
Glucose, UA: NEGATIVE mg/dL
Hgb urine dipstick: NEGATIVE
Ketones, ur: NEGATIVE mg/dL
Nitrite: POSITIVE — AB
Specific Gravity, Urine: 1.019 (ref 1.005–1.030)
pH: 7.5 (ref 5.0–8.0)

## 2022-03-28 LAB — CBC WITH DIFFERENTIAL/PLATELET
Abs Immature Granulocytes: 0.02 10*3/uL (ref 0.00–0.07)
Basophils Absolute: 0 10*3/uL (ref 0.0–0.1)
Basophils Relative: 1 %
Eosinophils Absolute: 0.3 10*3/uL (ref 0.0–0.5)
Eosinophils Relative: 4 %
HCT: 41 % (ref 36.0–46.0)
Hemoglobin: 13.8 g/dL (ref 12.0–15.0)
Immature Granulocytes: 0 %
Lymphocytes Relative: 34 %
Lymphs Abs: 2.6 10*3/uL (ref 0.7–4.0)
MCH: 31.4 pg (ref 26.0–34.0)
MCHC: 33.7 g/dL (ref 30.0–36.0)
MCV: 93.2 fL (ref 80.0–100.0)
Monocytes Absolute: 0.6 10*3/uL (ref 0.1–1.0)
Monocytes Relative: 8 %
Neutro Abs: 3.9 10*3/uL (ref 1.7–7.7)
Neutrophils Relative %: 53 %
Platelets: 252 10*3/uL (ref 150–400)
RBC: 4.4 MIL/uL (ref 3.87–5.11)
RDW: 12.4 % (ref 11.5–15.5)
WBC: 7.5 10*3/uL (ref 4.0–10.5)
nRBC: 0 % (ref 0.0–0.2)

## 2022-03-28 LAB — PREGNANCY, URINE: Preg Test, Ur: NEGATIVE

## 2022-03-28 LAB — LIPASE, BLOOD: Lipase: 21 U/L (ref 11–51)

## 2022-03-28 NOTE — ED Triage Notes (Signed)
Pt here from home with c/o abd pain along with some n/v noticed some blood in her last episode of vomiting

## 2022-09-06 ENCOUNTER — Encounter: Payer: Medicaid Other | Admitting: Advanced Practice Midwife

## 2023-05-13 ENCOUNTER — Emergency Department (HOSPITAL_BASED_OUTPATIENT_CLINIC_OR_DEPARTMENT_OTHER): Payer: Medicaid Other

## 2023-05-13 ENCOUNTER — Encounter (HOSPITAL_BASED_OUTPATIENT_CLINIC_OR_DEPARTMENT_OTHER): Payer: Self-pay

## 2023-05-13 ENCOUNTER — Emergency Department (HOSPITAL_BASED_OUTPATIENT_CLINIC_OR_DEPARTMENT_OTHER)
Admission: EM | Admit: 2023-05-13 | Discharge: 2023-05-13 | Disposition: A | Discharge status: Home / Self Care | Payer: Medicaid Other | Attending: Emergency Medicine | Admitting: Emergency Medicine

## 2023-05-13 ENCOUNTER — Other Ambulatory Visit: Payer: Self-pay

## 2023-05-13 DIAGNOSIS — B9689 Other specified bacterial agents as the cause of diseases classified elsewhere: Secondary | ICD-10-CM | POA: Diagnosis not present

## 2023-05-13 DIAGNOSIS — N760 Acute vaginitis: Secondary | ICD-10-CM | POA: Diagnosis not present

## 2023-05-13 DIAGNOSIS — N3000 Acute cystitis without hematuria: Secondary | ICD-10-CM | POA: Insufficient documentation

## 2023-05-13 DIAGNOSIS — R1030 Lower abdominal pain, unspecified: Secondary | ICD-10-CM

## 2023-05-13 LAB — WET PREP, GENITAL
Sperm: NONE SEEN
Trich, Wet Prep: NONE SEEN
WBC, Wet Prep HPF POC: <10 (ref <10)
Yeast Wet Prep HPF POC: NONE SEEN

## 2023-05-13 LAB — COMPREHENSIVE METABOLIC PANEL
ALT: 10 U/L (ref 0–44)
AST: 15 U/L (ref 15–41)
Albumin: 4.8 g/dL (ref 3.5–5.0)
Alkaline Phosphatase: 50 U/L (ref 38–126)
Anion gap: 8 (ref 5–15)
BUN: 7 mg/dL (ref 6–20)
CO2: 29 mmol/L (ref 22–32)
Calcium: 9.3 mg/dL (ref 8.9–10.3)
Chloride: 100 mmol/L (ref 98–111)
Creatinine, Ser: 0.75 mg/dL (ref 0.44–1.00)
GFR, Estimated: >60 mL/min (ref >60)
Glucose, Bld: 113 mg/dL — ABNORMAL HIGH (ref 70–99)
Potassium: 3.8 mmol/L (ref 3.5–5.1)
Sodium: 137 mmol/L (ref 135–145)
Total Bilirubin: 0.6 mg/dL (ref 0.0–1.2)
Total Protein: 7.6 g/dL (ref 6.5–8.1)

## 2023-05-13 LAB — HCG, SERUM, QUALITATIVE: Preg, Serum: NEGATIVE

## 2023-05-13 LAB — URINALYSIS, ROUTINE W REFLEX MICROSCOPIC
Bacteria, UA: NONE SEEN
Bilirubin Urine: NEGATIVE
Glucose, UA: NEGATIVE mg/dL
Hgb urine dipstick: NEGATIVE
Ketones, ur: NEGATIVE mg/dL
Leukocytes,Ua: NEGATIVE
Nitrite: POSITIVE — AB
Specific Gravity, Urine: >1.046 — ABNORMAL HIGH (ref 1.005–1.030)
pH: 8 (ref 5.0–8.0)

## 2023-05-13 LAB — CBC
HCT: 41.3 % (ref 36.0–46.0)
Hemoglobin: 14.3 g/dL (ref 12.0–15.0)
MCH: 32 pg (ref 26.0–34.0)
MCHC: 34.6 g/dL (ref 30.0–36.0)
MCV: 92.4 fL (ref 80.0–100.0)
Platelets: 300 10*3/uL (ref 150–400)
RBC: 4.47 MIL/uL (ref 3.87–5.11)
RDW: 13.2 % (ref 11.5–15.5)
WBC: 6.9 10*3/uL (ref 4.0–10.5)
nRBC: 0 % (ref 0.0–0.2)

## 2023-05-13 LAB — LIPASE, BLOOD: Lipase: 18 U/L (ref 11–51)

## 2023-05-13 MED ORDER — METRONIDAZOLE 500 MG PO TABS
500.0000 mg | ORAL_TABLET | Freq: Once | ORAL | Status: AC
  Administered 2023-05-13: 500 mg via ORAL
  Filled 2023-05-13: qty 1

## 2023-05-13 MED ORDER — ONDANSETRON HCL 4 MG/2ML IJ SOLN
4.0000 mg | Freq: Once | INTRAMUSCULAR | Status: AC
  Administered 2023-05-13: 4 mg via INTRAVENOUS
  Filled 2023-05-13: qty 2

## 2023-05-13 MED ORDER — KETOROLAC TROMETHAMINE 15 MG/ML IJ SOLN
15.0000 mg | Freq: Once | INTRAMUSCULAR | Status: AC
  Administered 2023-05-13: 15 mg via INTRAVENOUS
  Filled 2023-05-13: qty 1

## 2023-05-13 MED ORDER — METRONIDAZOLE 500 MG PO TABS: 500.0000 mg | ORAL_TABLET | Freq: Two times a day (BID) | ORAL | 0 refills | Status: AC

## 2023-05-13 MED ORDER — SODIUM CHLORIDE 0.9 % IV SOLN
1.0000 g | Freq: Once | INTRAVENOUS | Status: AC
  Administered 2023-05-13: 1 g via INTRAVENOUS
  Filled 2023-05-13: qty 10

## 2023-05-13 MED ORDER — IOHEXOL 300 MG/ML  SOLN
100.0000 mL | Freq: Once | INTRAMUSCULAR | Status: AC | PRN
  Administered 2023-05-13: 100 mL via INTRAVENOUS

## 2023-05-13 MED ORDER — CEPHALEXIN 500 MG PO CAPS: 500.0000 mg | ORAL_CAPSULE | Freq: Two times a day (BID) | ORAL | 0 refills | Status: DC

## 2023-05-13 NOTE — ED Provider Notes (Signed)
[place] EMERGENCY DEPARTMENT AT [facility] Provider Note   CSN: [id number] Arrival date & time: 05/13/23  1517     History  Chief Complaint  Patient presents with   Abdominal Pain    Virginia Freeman is a 38 y.o. female, history of tubal ligation, who presents to the ED secondary to abdominal bloating, discomfort, as well as bilateral lower quadrant abdominal pain, this been going on for the last month, and it has progressively gotten worse, until this week, that which she states now is intractable.  She notes that the pain is aching, stabbing, to bilateral lower quadrants, and she feels like she cannot bend.  She feels like her abdomen is extremely bloated, and it goes down after having a little bit of a bowel movement, but is still distended at all times.  She denies any vomiting, but reports persistent nausea.  States that her biological mother, had ovarian cancer, and she is concerned.  Denies any kind of trauma, vaginal bleeding.  But does endorse having some thin vaginal discharge.  Home Medications Prior to Admission medications   Medication Sig Start Date End Date Taking? Authorizing Provider  cephALEXin (KEFLEX) 500 MG capsule Take 1 capsule (500 mg total) by mouth 2 (two) times daily. 05/13/23  Yes Auda Finfrock L, PA  metroNIDAZOLE (FLAGYL) 500 MG tablet Take 1 tablet (500 mg total) by mouth 2 (two) times daily. 05/13/23  Yes Daveyon Kitchings L, PA  acetaminophen (TYLENOL) 500 MG tablet Take 1,500 mg by mouth every 6 (six) hours as needed.    [provider]  Celecoxib (CELEBREX PO) Take by mouth.    [provider]  gabapentin (NEURONTIN) 100 MG capsule Take 300 mg by mouth at bedtime. 07/27/16 08/11/18  [provider]  HYDROcodone-acetaminophen (NORCO/VICODIN) 5-325 MG tablet Take 1 tablet by mouth every 6 (six) hours as needed for pain. 09/17/16   [provider]  nabumetone (RELAFEN) 750 MG tablet Take 750 mg by mouth 2 (two) times  daily. 08/25/16   [provider]  ondansetron (ZOFRAN) 4 MG tablet Take 4 mg by mouth every 8 (eight) hours as needed for nausea.    [provider]  Polyethylene Glycol 3350-GRX POWD Take 17 g by mouth daily.    [provider]      Allergies    Patient has no known allergies.    Review of Systems   Review of Systems  Gastrointestinal:  Positive for abdominal pain.  Genitourinary:  Positive for pelvic pain. Negative for difficulty urinating.    Physical Exam Updated Vital Signs BP (!) 126/91   Pulse 68   Temp 98 F (36.7 C) (Oral)   Resp 18   Ht 5\' 7"  (1.702 m)   Wt 59 kg   SpO2 95%   BMI 20.36 kg/m  Physical Exam Vitals and nursing note reviewed.  Constitutional:      General: She is not in acute distress.    Appearance: She is well-developed.  HENT:     Head: Normocephalic and atraumatic.  Eyes:     Conjunctiva/sclera: Conjunctivae normal.  Cardiovascular:     Rate and Rhythm: Normal rate and regular rhythm.     Heart sounds: No murmur heard. Pulmonary:     Effort: Pulmonary effort is normal. No respiratory distress.     Breath sounds: Normal breath sounds.  Abdominal:     Palpations: Abdomen is soft.     Tenderness: There is abdominal tenderness in the right lower quadrant  and left lower quadrant.  Genitourinary:    Vagina: Vaginal discharge present. No erythema or bleeding.     Cervix: Normal.  Musculoskeletal:        General: No swelling.     Cervical back: Neck supple.  Skin:    General: Skin is warm and dry.     Capillary Refill: Capillary refill takes less than 2 seconds.  Neurological:     Mental Status: She is alert.  Psychiatric:        Mood and Affect: Mood normal.     ED Results / Procedures / Treatments   Labs (all labs ordered are listed, but only abnormal results are displayed) Labs Reviewed  WET PREP, GENITAL - Abnormal; Notable for the following components:      Result Value   Clue Cells Wet Prep HPF POC  PRESENT (*)    All other components within normal limits  COMPREHENSIVE METABOLIC PANEL - Abnormal; Notable for the following components:   Glucose, Bld 113 (*)    All other components within normal limits  URINALYSIS, ROUTINE W REFLEX MICROSCOPIC - Abnormal; Notable for the following components:   Specific Gravity, Urine >1.046 (*)    Protein, ur TRACE (*)    Nitrite POSITIVE (*)    All other components within normal limits  URINE CULTURE  LIPASE, BLOOD  CBC  HCG, SERUM, QUALITATIVE  GC/CHLAMYDIA PROBE AMP ([place]) NOT AT [facility]    EKG None  Radiology US PELVIC COMPLETE W TRANSVAGINAL AND TORSION R/O Result Date: 05/13/2023 CLINICAL DATA:  Generalized pelvic pain EXAM: TRANSABDOMINAL AND TRANSVAGINAL ULTRASOUND OF PELVIS DOPPLER ULTRASOUND OF OVARIES TECHNIQUE: Both transabdominal and transvaginal ultrasound examinations of the pelvis were performed. Transabdominal technique was performed for global imaging of the pelvis including uterus, ovaries, adnexal regions, and pelvic cul-de-sac. It was necessary to proceed with endovaginal exam following the transabdominal exam to visualize the ovaries. Color and duplex Doppler ultrasound was utilized to evaluate blood flow to the ovaries. COMPARISON:  None Available. FINDINGS: Uterus Measurements: 7.8 x 3.5 x 4.7 cm = volume: 69 mL. Anteverted. Two Chanette Demo fundal fibroids measuring up to 1.0 cm and 0.8 cm in size. Endometrium Thickness: 3 mm.  No focal abnormality visualized. Right ovary Measurements: 3.2 x 2.6 x 2.8 cm = volume: 12 mL. Normal appearance/no adnexal mass. Left ovary Not visualized.  No adnexal mass is seen. Pulsed Doppler evaluation of the right ovary demonstrates normal low-resistance arterial and venous waveforms. Other findings Trace free fluid in the cul-de-sac, likely physiologic. IMPRESSION: 1. No acute findings within the pelvis. 2. Two Jazzelle Zhang fundal fibroids measuring up to 1.0 cm in size. 3. Nonvisualization of the left ovary.  Electronically Signed   By: Duanne Guess D.O.   On: 05/13/2023 19:07    Procedures Procedures    Medications Ordered in ED Medications  cefTRIAXone (ROCEPHIN) 1 g in sodium chloride 0.9 % 100 mL IVPB (has no administration in time range)  metroNIDAZOLE (FLAGYL) tablet 500 mg (has no administration in time range)  ketorolac (TORADOL) 15 MG/ML injection 15 mg (15 mg Intravenous Given 05/13/23 1924)  ondansetron (ZOFRAN) injection 4 mg (4 mg Intravenous Given 05/13/23 1924)  iohexol (OMNIPAQUE) 300 MG/ML solution 100 mL (100 mLs Intravenous Contrast Given 05/13/23 1931)    ED Course/ Medical Decision Making/ A&P                                 Medical  Decision Making Patient is a 38 year old female, here for bilateral lower abdominal pain, has been going on for the last month, worsening for the last week.  She states she has a history of ovarian cancer in her family, and feels very bloated.  She send she has so much pelvic pain, we will obtain a transvaginal ultrasound, as well as a CT abdomen pelvis, given that she has extreme bloating, and severe pain, difficulty with bowel movements.  Will also do a pelvic, and test for BV, gonorrhea, and chlamydia.  Amount and/or Complexity of Data Reviewed Labs: ordered.    Details: Unremarkable except for positive clue cells, nitrate positive Radiology: ordered.    Details: CT abdomen pelvis, shows fairly no acute findings, except for some fluid in her pelvis, and some fibroids.  Transvaginal ultrasound shows no acute findings Discussion of management or test interpretation with external provider(s): Discussed with patient, her overall findings are fairly benign, she does have BV and nitrate positive UTI, or nitrate positive urine, we will treat as a urinary tract infection, as she provided me with a paper from [facility], just showed that she had many leukocytes, in her urine, and many nitrates.  We go ahead and treat her with ceftriaxone  and Keflex, and treat her with Flagyl for the BV.  Instructed on return precautions and advised to follow-up with OB/GYN, as this may be a recurrent cystic issue that is causing the pain when the cysts burst.  Risk Prescription drug management.    Final Clinical Impression(s) / ED Diagnoses Final diagnoses:  Acute cystitis without hematuria  Bacterial vaginosis  Lower abdominal pain    Rx / DC Orders ED Discharge Orders          Ordered    cephALEXin (KEFLEX) 500 MG capsule  2 times daily        05/13/23 2039    metroNIDAZOLE (FLAGYL) 500 MG tablet  2 times daily        05/13/23 2039              Kashina Mecum, [place], [place] 05/13/23 2043    Tanda Rockers A, DO 05/14/23 1424

## 2023-05-13 NOTE — Discharge Instructions (Signed)
Your findings today were overall very benign.  You may have a urinary tract infection as you have nitrates in your urine as well as you have BV, which is a vaginal infection.  Take the antibiotics as prescribed, return if you feel like your symptoms are worsening.  Please follow-up with your OB/GYN, this may be secondary to your fibroid pain, versus ovarian cyst pain.  You do not have any acute ovarian cyst on my exam today however there is some free fluid in your pelvis, which may be from a ruptured cyst.

## 2023-05-13 NOTE — ED Triage Notes (Signed)
Pt c/o nausea & abdominal pain that started about a week ago.

## 2023-05-16 LAB — GC/CHLAMYDIA PROBE AMP (~~LOC~~) NOT AT ARMC
Chlamydia: NEGATIVE
Comment: NEGATIVE
Comment: NORMAL
Neisseria Gonorrhea: NEGATIVE

## 2023-05-16 LAB — URINE CULTURE: Culture: >=100000 — AB

## 2023-05-17 ENCOUNTER — Telehealth (HOSPITAL_BASED_OUTPATIENT_CLINIC_OR_DEPARTMENT_OTHER): Payer: Self-pay

## 2023-05-17 NOTE — Telephone Encounter (Signed)
Post ED Visit - Positive Culture Follow-up  Culture report reviewed by antimicrobial stewardship pharmacist: [facility] [x]  Calton Dach, Pharm.D. []  Celedonio Miyamoto, Pharm.D., BCPS AQ-ID []  Garvin Fila, Pharm.D., BCPS []  [place]ina Pillion, [address].D., BCPS []  [place], [address].D., BCPS, AAHIVP []  Estella Husk, Pharm.D., BCPS, AAHIVP []  Lysle Pearl, PharmD, BCPS []  Phillips Climes, PharmD, BCPS []  Agapito Games, PharmD, BCPS []  Verlan Friends, PharmD []  Mervyn Gay, PharmD, BCPS []  Vinnie Level, PharmD  [facility] []  Len Childs, PharmD []  Greer Pickerel, PharmD []  Adalberto Cole, PharmD []  Perlie Gold, Rph []  Lonell Face) Jean Rosenthal, PharmD []  Earl Many, PharmD []  Junita Push, PharmD []  Dorna Leitz, PharmD []  Terrilee Files, PharmD []  Lynann Beaver, PharmD []  Keturah Barre, PharmD []  Loralee Pacas, PharmD []  Bernadene Person, PharmD   Positive urine culture Treated with Cephalexin and Metronidazole, organism sensitive to the same and no further patient follow-up is required at this time.  Sandria Senter 05/17/2023, 9:34 AM

## 2024-02-24 NOTE — Progress Notes (Signed)
 [facility] [address] [place], [place] [address] Ph: [phone] Fax: [phone]        Complete Physical        Patient presents with  . Annual Exam      Assessment / Plan:    Dee was seen today for annual exam.  Diagnoses and all orders for this visit:  Encounter for routine adult physical exam with abnormal findings -     CBC And Differential; Future -     Comprehensive Metabolic Panel; Future -     Comprehensive Metabolic Panel -     CBC And Differential Yearly  PTSD (post-traumatic stress disorder) -     QUEtiapine fumarate (SEROQUEL) 100 mg tablet; Take one tablet (100 mg dose) by mouth at bedtime. Keep appt on 9/19 Continue follow up with Psych  Bipolar 2 disorder (*) -     QUEtiapine fumarate (SEROQUEL) 100 mg tablet; Take one tablet (100 mg dose) by mouth at bedtime. Keep appt on 9/19 Continue follow up with psych  Encounter for hepatitis C screening test for low risk patient -     Hepatitis C Virus Antibody rflx Qnt Real-time PCR; Future -     Hepatitis C Virus Antibody rflx Qnt Real-time PCR  Screening cholesterol level -     Lipid Panel With LDL/HDL Ratio; Future -     Lipid Panel With LDL/HDL Ratio  Screening for thyroid  disorder -     TSH; Future -     TSH  Intractable chronic migraine with aura and without status migrainosus -     propranolol HCl (INDERAL LA) 60 mg 24 hr capsule; Take one capsule (60 mg dose) by mouth daily.  Vitamin D deficiency -     Vitamin D 25 Hydroxy; Future -     Vitamin D 25 Hydroxy  Low serum vitamin B12 -     Vitamin B12; Future -     Vitamin B12  Impaired fasting glucose -     Hemoglobin A1c; Future -     Hemoglobin A1c  Dysuria -     POCT urinalysis dipstick -     Culture, Urine - including low colony counts Urine; Future -     hCG, Quantitative; Future -     hCG, Quantitative -     Culture, Urine - including low colony counts Urine    Plan Yearly CPE All  health maintenance and immunizations reviewed. Encouraged heart healthy diet and active lifestyle within physical limitations.  Goal for cardio exercise is 30 5/7 days. Measures to promote bone health reviewed. If smoker counseled/coordinated care regarding tobacco use, longterm complications, and smoking cessation including available treatment options and potential benefits and side effects. If alcohol consumed advised to limit to 2 drinks/day men; 1 drink/day women.  Self breast exam monthly. Technique reviewed.  Cervical cancer screening DUE GYN   No follow-ups on file. Orders Placed This Encounter  Procedures  . Culture, Urine - including low colony counts Urine  . CBC And Differential  . Comprehensive Metabolic Panel  . Hepatitis C Virus Antibody rflx Qnt Real-time PCR  . Hemoglobin A1c  . Lipid Panel With LDL/HDL Ratio  . TSH  . Vitamin B12  . Vitamin D 25 Hydroxy  . hCG, Quantitative  . Interpretation  . POCT urinalysis dipstick   Reassurance, risks, benefits, and alternatives of the medications and treatment plan prescribed today were discussed, and patient expressed understanding. Plan follow-up as discussed or as needed  if any worsening symptoms or change in condition.    Face to face interview, assessment, counseling and chart review preformed.    Patient's Medications       * Accurate as of February 24, 2024 11:59 PM. Reflects encounter med changes as of last refresh          New Prescriptions      Instructions  propranolol HCl 60 mg 24 hr capsule Commonly known as: INDERAL LA Replaces: propranolol HCl 40 mg tablet Started by: Kyra McClanahan, NP  60 mg, Oral, Daily       Continued Medications      Instructions  levothyroxine sodium 25 mcg tablet Commonly known as: SYNTHROID,LOVOTHROID,LEVOXYL  25 mcg, Oral, Daily before breakfast   methocarbamol 500 mg tablet Commonly known as: ROBAXIN  500 mg, Oral, 3 times a day   triamcinolone 0.5%  ointment Commonly known as: KENALOG  Topical, 2 times a day       Modified Medications      Instructions  QUEtiapine fumarate 100 mg tablet Commonly known as: SEROQUEL What changed: how much to take Changed by: Kyra McClanahan, NP  100 mg, Oral, At bedtime, Keep appt on 9/19       Discontinued Medications    propranolol HCl 40 mg tablet Commonly known as: INDERAL Replaced by: propranolol HCl 60 mg 24 hr capsule Stopped by: Kyra McClanahan, NP           HPI:    Virginia Freeman  [] is  [x] is not   fasting today.  HPI:  Here today for CPE   Additional complaints beyond the scope of the Wellness Exam include:    Has cramping and burning with urination.  Having breast tenderness. Has had transvaginal ultrasound and pending Gyn referral for Mass on left ovary and cyst on right .  Pain is mainly on the left side.    Hypothyroidism was prescribed levothyroxine 25mcg.  She is not able.. to tolerate this medication.  She ned up throwing up the next day.  She has not been taking this this will check levels today.  Taking propanolol 40mg  twice  Was taking Seroquel 100 but her psychiatrist but they stopped taking medicaid. Has taken Risperdal, sertraline and gabapentin Taking methocarbamol as needs for the cramping.  History of seizures, has taken seizure medication in the past and this was not effective.l   Reports she no longer is on medications.  Reports that the bipolar medication have kept symptoms today.  She does have aura before.  Last noted seizure 2-3 months ago. Does not drive if she has migraines.     Health Maintenance: Last wellness visit:  year Diet:  general Calcium supplementation:  never Vitamin D supplementation:  never Exercise frequency:  never Exercise type:  Works on a farm   Pap: was normal 2018 DUE Mammogram:  patient has never had a mammogram DEXA:  No Colonoscopy:  Yes, acute  LMP: ablation Is sexually  active Contraception: Tubal Concern for STDs: Declines Children: 19,10 Smoking status: Vape Alcohol: 2/7 days Recreational drugs: N/A  Dentist: DUE UTD with eye exam Yearly Dermatologist yearly: DUE Recent ED visits: N/A New medications since last visitN/A   FH N/A CA: N/A FH CRC 45: Paternal Great Grandmother  Denies blood in stool or dark, tarry stools  FH sudden cardiac death before age of 32: Mother  HIV Screening: Complete Hep C Screening: Today   Immunization History  Administered Date(s) Administered  . DTP 06/13/1987, 01/26/1991  .  Hepatitis B, unspecified formulation 01/22/1998, 02/26/1998, 07/09/1998  . Influenza Preserv. free 05/11/2016, 01/19/2017  . MMR 01/26/1991  . OPV 06/13/1987, 01/26/1991    SARS-COV2: Personal history of x4, no hospitalization    Patient Active Problem List   Diagnosis Date Noted  . Irritable bowel syndrome with constipation 12/02/2022  . Bipolar 2 disorder (*) 05/27/2021  . 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 Mixed obsessional thoughts and acts 05/27/2021  . PTSD (post-traumatic stress disorder) 05/27/2021  . Chronic pelvic pain in female 09/29/2020  . Endometriosis determined by laparoscopy 09/29/2020    Fulguration of one implant in the left posterior culdesac   . Lumbar radiculopathy 12/07/2018  . Menorrhagia with regular cycle 11/19/2016    S/p novasure ablation on 11/19/16   . Dysmenorrhea 11/19/2016  . Other specified hypothyroidism 08/16/2016    08/12/16: TSH of 8.5- Endocrinology referral   . Osteoarthritis of spine with radiculopathy, cervical region 06/30/2016  . Arthropathy of cervical facet joint 06/30/2016  . Lumbar facet arthropathy 06/30/2016  . Radiculopathy, lumbar region right L5 06/30/2016  . Chronic migraine 03/24/2016   Allergies[1]  Medications Taking[2]  Past Medical History:  Diagnosis Date  . Anemia   . Anxiety   . Back pain, chronic   . Chipped tooth     UPPER LEFT - SEEING DENTIST TODAY PER PT. 11/12/16.  SABRA Dental crowns present   . Disease of thyroid  gland    LOW PER PT. 11/12/16.  . Eating disorder   . Headache   . Heart murmur    STATES GETS OUT OF RYTHM IF RUNS A LOT OR GETS OUT OF BREATH.  NO PROBLEM SINCE 2014 PER PT. 11/12/16.  . Inflammatory bowel disease   . Lumbar disc disease   . Panic attacks   . Sciatica   . Seizures (*)   . Spondylosis of cervical region without myelopathy or radiculopathy 06/30/2016  . Teeth missing    A FEW.   Past Surgical History:  Procedure Laterality Date  . Cervical biopsy  w/ loop electrode excision    . Cesarean section  2006  . Cesarean section  2015  . Colonoscopy  12/06/2016  . Laparoscopic appendectomy  09/04/2016   DR. FONDA PINE.  WAS AN EMERGENCY PER PT.  SABRA Pelvic laparoscopy  09/29/2020   fulguration of one implant of endometriosis  . Tubal ligation    . Upper gastrointestinal endoscopy  12/06/2016   Social History[3] Family History  Adopted: Yes  Problem Relation Age of Onset  . Crohn's disease Mother   . Schizophrenia Mother   . Bipolar disorder Mother   . Heart disease Mother   . Migraines Mother   . Seizures Mother   . Stroke Mother   . Autoimmune disease Mother   . Arthritis Mother   . Birth defects Mother   . Mental illness Mother   . Bipolar disorder Father   . Alcohol abuse Father   . Migraines Father   . Drug abuse Father   . Mental illness Father   . Breast cancer Maternal Aunt   . Cancer Maternal Aunt   . Cancer Maternal Uncle   . Cancer Maternal Grandfather   . Colon cancer Maternal Grandfather   . Alcohol abuse Maternal Grandfather   . Arthritis Maternal Grandfather   . COPD Maternal Grandfather   . Cancer Maternal Grandmother   . Heart disease Maternal Grandmother   . Arthritis Maternal Grandmother   . Birth defects Maternal Grandmother   .  COPD Maternal Grandmother   . Cancer Paternal Grandfather   . Heart disease Paternal Grandfather    . Diabetes Paternal Grandfather   . Cancer Paternal Grandmother   . Heart disease Paternal Grandmother   . Learning disabilities Daughter   . Learning disabilities Daughter   . Ovarian cancer Neg Hx   . Hypertension Neg Hx   . Colon polyps Neg Hx     Lab Results  Component Value Date   WBC 6.0 02/24/2024   Hemoglobin 13.1 02/24/2024   Hematocrit 39.0 02/24/2024   MCV 96 02/24/2024   Platelet Count 294 02/24/2024   Lab Results  Component Value Date   Creatinine 0.83 02/24/2024   BUN 8 02/24/2024   Sodium 139 02/24/2024   Potassium 4.0 02/24/2024   Chloride 101 02/24/2024   CO2 22 02/24/2024   Lab Results  Component Value Date   ALT (SGPT) 12 02/24/2024   AST 18 02/24/2024   Alkaline Phosphatase 57 02/24/2024   Total Bilirubin 0.4 02/24/2024   Lab Results  Component Value Date   Hemoglobin A1c 5.3 02/24/2024   No components found for: [facility] Lab Results  Component Value Date   TSH 6.240 (H) 02/24/2024      Subjective   Review of Systems  Constitutional: Negative.   HENT: Negative.    Eyes: Negative.   Respiratory: Negative.    Cardiovascular: Negative.   Gastrointestinal:  Positive for abdominal pain.  Genitourinary:  Positive for dysuria. Negative for flank pain, frequency, hematuria and urgency.  Musculoskeletal: Negative.   Skin: Negative.   Neurological: Negative.   Endo/Heme/Allergies: Negative.   Psychiatric/Behavioral: Negative.        Objective   BP 110/68 (BP Location: Left Upper Arm, Patient Position: Sitting)   Pulse 79   Temp 98.2 F (36.8 C) (Oral)   Resp 16   Ht 5' 6.5 (1.689 m)   Wt 127 lb 12.8 oz (58 kg)   SpO2 98%   BMI 20.32 kg/m   Wt Readings from Last 3 Encounters:  02/24/24 127 lb 12.8 oz (58 kg)  02/09/24 126 lb 12.8 oz (57.5 kg)  01/27/24 125 lb (56.7 kg)     General: Well developed, well nourished, no distress HEENT - Normocephalic. PERRLA, EOM's intact TM's, nose and pharynx negative.  Dentition  normal. N/N - supple without adenopathy.  Thyroid  normal without enlargement, nodularity or tendernss.  Negative bruit.   CV- regular rate, rhythm without murmur, gallop or rubs.  Extremities:  No clubbing, no cyanosis, no edema, Dorsalis Pedis and Posterior Tibial pulses 2+ Respiratory- breath sounds CTA all fields without wheezes, rales or rhonchi.   Abdomen - soft, nontender without masses, organomegaly, or tenderness.  Negative bruit.   MSK - spine midline without abnormal curvature. Full range-of-motion all joints without tenderness, bony abnormality, crepitus, erythema or effusion. Gait normal  Skin:  Warm and dry without lesions.  Neuro:  Alert and oriented x3, cognition intact. DTRs/strength/sensation symmetrical and normal. Cranial nerves II-XI intact. Psych: Mood/affect/behavior/thought content/judgement normal. Denies SI/HI      Efrain Broaden, NP 02/24/2024, 3:12 PM        [1] Allergies Allergen Reactions  . Codeine Itching  . Latex Other    burning  [2] Outpatient Medications Marked as Taking for the 02/24/24 encounter (Annual Physical) with Kyra McClanahan, NP  Medication Sig Dispense Refill  . methocarbamol (ROBAXIN) 500 mg tablet Take one tablet (500 mg dose) by mouth 3 (three) times a day. 30 tablet 0  . [DISCONTINUED]  propranolol HCl (INDERAL) 40 mg tablet Take one tablet (40 mg dose) by mouth 2 (two) times daily. 60 tablet 5  . [DISCONTINUED] QUEtiapine fumarate (SEROQUEL) 100 mg tablet Take one and a half tablets (150 mg dose) by mouth at bedtime for 30 days. Keep appt on 9/19 45 tablet 0  . triamcinolone (KENALOG) 0.5% ointment Apply topically 2 (two) times daily. 30 g 0  [3] Social History Socioeconomic History  . Marital status: Significant Other  . Number of children: 2  . Years of education: 10  Tobacco Use  . Smoking status: Former    Current packs/day: 0.00    Types: Cigarettes    Passive exposure: Past  . Smokeless tobacco: Never  . Tobacco  comments:    PACK EVERY 3 DAYS.  Vaping Use  . Vaping status: Some Days  . Start date: 07/05/2020  . Substances: Nicotine, Flavoring  . Devices: Disposable  Substance and Sexual Activity  . Alcohol use: Yes    Alcohol/week: 0.0 - 1.0 standard drinks of alcohol    Comment: occasional drinker  . Drug use: No  . Sexual activity: Yes    Partners: Male    Birth control/protection: Surgical    Comment: BTL  *Some images could not be shown.

## 2024-02-29 ENCOUNTER — Encounter: Admitting: Obstetrics

## 2024-03-20 ENCOUNTER — Encounter (HOSPITAL_BASED_OUTPATIENT_CLINIC_OR_DEPARTMENT_OTHER): Payer: Self-pay | Admitting: Emergency Medicine

## 2024-03-20 ENCOUNTER — Encounter: Admitting: Obstetrics & Gynecology

## 2024-03-20 ENCOUNTER — Other Ambulatory Visit: Payer: Self-pay

## 2024-03-20 ENCOUNTER — Emergency Department (HOSPITAL_BASED_OUTPATIENT_CLINIC_OR_DEPARTMENT_OTHER)

## 2024-03-20 ENCOUNTER — Emergency Department (HOSPITAL_BASED_OUTPATIENT_CLINIC_OR_DEPARTMENT_OTHER)
Admission: EM | Admit: 2024-03-20 | Discharge: 2024-03-20 | Disposition: A | Discharge status: Home / Self Care | Attending: Emergency Medicine | Admitting: Emergency Medicine

## 2024-03-20 DIAGNOSIS — N3000 Acute cystitis without hematuria: Secondary | ICD-10-CM | POA: Insufficient documentation

## 2024-03-20 DIAGNOSIS — R109 Unspecified abdominal pain: Secondary | ICD-10-CM

## 2024-03-20 DIAGNOSIS — R1032 Left lower quadrant pain: Secondary | ICD-10-CM | POA: Diagnosis present

## 2024-03-20 DIAGNOSIS — D259 Leiomyoma of uterus, unspecified: Secondary | ICD-10-CM | POA: Insufficient documentation

## 2024-03-20 LAB — URINALYSIS, ROUTINE W REFLEX MICROSCOPIC
Bilirubin Urine: NEGATIVE
Glucose, UA: NEGATIVE mg/dL
Hgb urine dipstick: NEGATIVE
Ketones, ur: NEGATIVE mg/dL
Nitrite: POSITIVE — AB
Protein, ur: NEGATIVE mg/dL
Specific Gravity, Urine: 1.012 (ref 1.005–1.030)
WBC, UA: >50 WBC/hpf (ref 0–5)
pH: 6 (ref 5.0–8.0)

## 2024-03-20 LAB — BASIC METABOLIC PANEL WITH GFR
Anion gap: 11 (ref 5–15)
BUN: 7 mg/dL (ref 6–20)
CO2: 24 mmol/L (ref 22–32)
Calcium: 10 mg/dL (ref 8.9–10.3)
Chloride: 104 mmol/L (ref 98–111)
Creatinine, Ser: 0.75 mg/dL (ref 0.44–1.00)
GFR, Estimated: >60 mL/min (ref >60)
Glucose, Bld: 114 mg/dL — ABNORMAL HIGH (ref 70–99)
Potassium: 4.2 mmol/L (ref 3.5–5.1)
Sodium: 138 mmol/L (ref 135–145)

## 2024-03-20 LAB — CBC
HCT: 39.3 % (ref 36.0–46.0)
Hemoglobin: 13.6 g/dL (ref 12.0–15.0)
MCH: 32.2 pg (ref 26.0–34.0)
MCHC: 34.6 g/dL (ref 30.0–36.0)
MCV: 93.1 fL (ref 80.0–100.0)
Platelets: 252 K/uL (ref 150–400)
RBC: 4.22 MIL/uL (ref 3.87–5.11)
RDW: 12.1 % (ref 11.5–15.5)
WBC: 6.5 K/uL (ref 4.0–10.5)
nRBC: 0 % (ref 0.0–0.2)

## 2024-03-20 LAB — HCG, SERUM, QUALITATIVE: Preg, Serum: NEGATIVE

## 2024-03-20 MED ORDER — OXYCODONE-ACETAMINOPHEN 5-325 MG PO TABS: 1.0000 | ORAL_TABLET | Freq: Four times a day (QID) | ORAL | 0 refills | Status: DC | PRN

## 2024-03-20 MED ORDER — MORPHINE SULFATE (PF) 4 MG/ML IV SOLN
4.0000 mg | Freq: Once | INTRAVENOUS | Status: AC
  Administered 2024-03-20: 4 mg via INTRAVENOUS
  Filled 2024-03-20: qty 1

## 2024-03-20 MED ORDER — CEPHALEXIN 500 MG PO CAPS: 500.0000 mg | ORAL_CAPSULE | Freq: Two times a day (BID) | ORAL | 0 refills | Status: AC

## 2024-03-20 NOTE — ED Provider Notes (Signed)
 [place] EMERGENCY DEPARTMENT AT [facility] Provider Note   CSN: [id number] Arrival date & time: 03/20/24  1305     Patient presents with: Pelvic Pain   Virginia Freeman is a 38 y.o. female.   38 year old female presenting with left lower quadrant pain.  Patient notes that her pain woke her up out of a dead sleep this morning, describes a stabbing pain, like someone is taking ice cream scoop and scooping out my left side.  She was told several weeks ago at her OB/GYN that she had an ovarian cyst on the left side, she has noted pelvic pressure and discomfort since that time but today the pain is much worse.  She denies nausea/vomiting/diarrhea, fever, dysuria/hematuria.  History of C-section x 2 and appendectomy.    Pelvic Pain       Prior to Admission medications   Medication Sig Start Date End Date Taking? Authorizing Provider  acetaminophen  (TYLENOL ) 500 MG tablet Take 1,500 mg by mouth every 6 (six) hours as needed.    [provider]  Celecoxib (CELEBREX PO) Take by mouth.    [provider]  cephALEXin  (KEFLEX ) 500 MG capsule Take 1 capsule (500 mg total) by mouth 2 (two) times daily. 05/13/23   Small, Brooke L, PA  gabapentin (NEURONTIN) 100 MG capsule Take 300 mg by mouth at bedtime. 07/27/16 08/11/18  [provider]  HYDROcodone-acetaminophen  (NORCO/VICODIN) 5-325 MG tablet Take 1 tablet by mouth every 6 (six) hours as needed for pain. 09/17/16   [provider]  metroNIDAZOLE  (FLAGYL ) 500 MG tablet Take 1 tablet (500 mg total) by mouth 2 (two) times daily. 05/13/23   Small, Brooke L, PA  nabumetone (RELAFEN) 750 MG tablet Take 750 mg by mouth 2 (two) times daily. 08/25/16   [provider]  ondansetron  (ZOFRAN ) 4 MG tablet Take 4 mg by mouth every 8 (eight) hours as needed for nausea.    [provider]  Polyethylene Glycol 3350-GRX POWD Take 17 g by mouth daily.    [provider]    Allergies:  Patient has no known allergies.    Review of Systems  Genitourinary:  Positive for pelvic pain.    Updated Vital Signs  Vitals:   03/20/24 1500 03/20/24 1512 03/20/24 1513 03/20/24 1530  BP: (!) 118/93   123/89  Pulse: (!) 59   68  Resp: 16   15  Temp:      SpO2: 97% 98%  92%  Weight:   59 kg   Height:   5' 7 (1.702 m)      Physical Exam Vitals and nursing note reviewed.  HENT:     Head: Normocephalic.  Eyes:     Extraocular Movements: Extraocular movements intact.  Cardiovascular:     Rate and Rhythm: Normal rate.  Pulmonary:     Effort: Pulmonary effort is normal.  Abdominal:     Palpations: Abdomen is soft.     Tenderness: There is abdominal tenderness. There is guarding.     Comments: Tenderness/guarding in LLQ > suprapubic  Genitourinary:    Comments: Patient deferred pelvic exam Musculoskeletal:     Cervical back: Normal range of motion.     Comments: Moves all extremities spontaneously without difficulty  Skin:    General: Skin is warm and dry.  Neurological:     Mental Status: She is alert and oriented to person, place, and time.     (all labs ordered are listed, but only abnormal results are displayed)  Labs Reviewed  BASIC METABOLIC PANEL WITH GFR - Abnormal; Notable for the following components:      Result Value   Glucose, Bld 114 (*)    All other components within normal limits  URINALYSIS, ROUTINE W REFLEX MICROSCOPIC - Abnormal; Notable for the following components:   APPearance HAZY (*)    Nitrite POSITIVE (*)    Leukocytes,Ua MODERATE (*)    Bacteria, UA RARE (*)    All other components within normal limits  URINE CULTURE  CBC  HCG, SERUM, QUALITATIVE    EKG: None  Radiology: US  PELVIC COMPLETE W TRANSVAGINAL AND TORSION R/O Result Date: 03/20/2024 EXAM: US  Pelvis, Complete Transvaginal and Transabdominal without Doppler TECHNIQUE: Transabdominal and transvaginal pelvic duplex ultrasound using B-mode/gray scaled imaging was  obtained. Doppler spectral analysis and color flow were utilized for evaluation of ovarian flow. COMPARISON: US  Pelvis Complete 05/13/2023. CLINICAL HISTORY: Left lower quadrant pain for 4-5 weeks, history of ovarian cyst, prior endometrial ablation. FINDINGS: UTERUS: Uterus measures 7.9 x 4.2 x 4.6 cm. There is poor demarcation of the myometrial/endometrial junction likely reflecting the sequelae of prior endometrial ablation. 2 cysts are seen within the submucosal fundus measuring up to 1 cm in size, stable since prior examination of 05/13/2023 and possible reflection of sequelae of prior ablation or potentially adenomyosis. An additional 16 mm heterogeneously hypoechoic solid mass seen within the subserosal anterior fundus in keeping with a uterine fibroid. ENDOMETRIAL STRIPE: Endometrium measures 0.5 cm. Endometrial stripe is within normal limits. RIGHT OVARY: Right ovary measures 4.1 x 2.3 x 2.6 cm. Right ovary is within normal limits. There is normal arterial and venous Doppler waveforms. LEFT OVARY: Left ovary measures 3.1 x 1.5 x 1.7 cm. Left ovary is within normal limits. There is normal arterial and venous Doppler waveforms. FREE FLUID: Small simple-appearing free fluid is seen within the cul-de-sac. CERVIX: The cervix is unremarkable. IMPRESSION: 1. Stable submucosal fundal cysts, possibly sequelae of prior endometrial ablation or adenomyosis; stable since 05/13/2023. 2. 16 mm subserosal anterior fundal uterine fibroid. 3. Small simple-appearing free fluid in the cul-de-sac. Electronically signed by: Dorethia Molt MD 03/20/2024 05:17 PM EST RP Workstation: [id number]     Procedures   Medications Ordered in the ED  morphine  (PF) 4 MG/ML injection 4 mg (4 mg Intravenous Given 03/20/24 1605)                                    Medical Decision Making This patient presents to the ED for concern of left lower quadrant abdominal pain, this involves an extensive number of treatment options, and is a  complaint that carries with it a high risk of complications and morbidity.  The differential diagnosis includes ruptured ovarian cyst, ovarian torsion, uterine fibroids, diverticulitis, other intra-abdominal pathology   Co morbidities that complicate the patient evaluation  History of uterine fibroids and ovarian cysts   Additional history obtained:  Additional history obtained from record review External records from outside source obtained and reviewed including results of pelvic ultrasound that was completed at an outside facility at the end of October   Lab Tests:  I Ordered, and personally interpreted labs.  The pertinent results include: CBC within normal limits.  BMP unremarkable.  Serum hCG negative.  Urinalysis notable for positive nitrites with moderate leukocytes and rare bacteria, suggestive of urinary tract infection, will send for urine culture.   Imaging Studies ordered:  I ordered imaging studies  including pelvic US   I independently visualized and interpreted imaging which showed 1. Stable submucosal fundal cysts, possibly sequelae of prior endometrial ablation or adenomyosis; stable since 05/13/2023. 2. 16 mm subserosal anterior fundal uterine fibroid. 3. Small simple-appearing free fluid in the cul-de-sac.  I agree with the radiologist interpretation   Cardiac Monitoring: / EKG:  The patient was maintained on a cardiac monitor.  I personally viewed and interpreted the cardiac monitored which showed an underlying rhythm of: NSR  Problem List / ED Course / Critical interventions / Medication management  I ordered medication including morphine   for pain  Reevaluation of the patient after these medicines showed that the patient improved I have reviewed the patients home medicines and have made adjustments as needed   Social Determinants of Health:  Tobacco use   Test / Admission - Considered:  Physical exam is notable as above, patient does endorse  tenderness/guarding in the lower abdomen, specifically in the LLQ > suprapubic region.  Patient notes that she was recently diagnosed with a left sided ovarian cyst, given the location of pain I do have a concern for ovarian torsion as contributing to her symptoms today, we will proceed with ultrasound to rule out torsion.  Ultrasound notable as above, ovaries are without evidence of torsion however patient does have uterine cysts/fibroids which may be contributing to her symptoms today.   I discussed this in depth with the patient, at time of my reassessment patient has had no recurrence of her left lower quadrant/suprapubic discomfort since receiving pain medications.   Patient denies urinary symptoms but did have nitrites/leukocytes noted on urinalysis today, will proceed with course of Keflex .   I did recommend proceeding with a pelvic exam for further evaluation of her symptoms and to obtain samples for gonorrhea/chlamydia/BV/yeast/trichomoniasis testing, patient declined pelvic exam today and prefers to discuss the findings of her ultrasound with her OB/GYN tomorrow.  I also offered to allow the patient to self swab for the conditions mentioned above, as she does note a history of recurrent BV, however she declines to self swab today.  I feel this is reasonable as she plans to have close follow-up with her OB/GYN.   At this time, I have a low suspicion for other intra-abdominal pathology as contributing to her symptoms today given her reassuring workup as above.  I discussed strict return precautions in depth with the patient, I recommend that she continue Advil for pain but will prescribe short course of Percocet to be used as needed for breakthrough pain.  Patient voiced understanding and is in agreement, she is appropriate for discharge at this time.    Amount and/or Complexity of Data Reviewed Labs: ordered. Radiology: ordered.  Risk Prescription drug management.        Final diagnoses:   Acute cystitis without hematuria  Abdominal pain, unspecified abdominal location  Uterine leiomyoma, unspecified location    ED Discharge Orders          Ordered    cephALEXin  (KEFLEX ) 500 MG capsule  2 times daily        03/20/24 1802    oxyCODONE -acetaminophen  (PERCOCET/ROXICET) 5-325 MG tablet  Every 6 hours PRN        03/20/24 1802               Glendia Rocky SAILOR, PA-C 03/20/24 1812    Virginia Freeman LABOR, DO 03/28/24 [phone]

## 2024-03-20 NOTE — Discharge Instructions (Addendum)
 You do not have any evidence of ovarian torsion or ovarian cysts on your pelvic ultrasound today, however you do have evidence of uterine fibroids, which may be contributing to your discomfort.  Please discuss this further with your OB/GYN, as you may benefit from surgical management.  You were also found to have a urinary tract infection, start Keflex  and use 1 tablet by mouth twice daily for 7 days.  Continue Advil as needed for pain, you may use Percocet every 6 hours as needed for breakthrough pain, please be aware that this medication may cause fatigue/drowsiness and should not be used prior to operating heavy machinery.  Please return to the emergency department if your symptoms worsen.

## 2024-03-20 NOTE — ED Triage Notes (Signed)
 C/o left sided pelvic pain. Hx of ovarian cyst on same side. Denies bleeding.

## 2024-03-22 LAB — URINE CULTURE: Culture: >=100000 — AB

## 2024-03-23 ENCOUNTER — Telehealth (HOSPITAL_BASED_OUTPATIENT_CLINIC_OR_DEPARTMENT_OTHER): Payer: Self-pay

## 2024-03-23 NOTE — Telephone Encounter (Signed)
 Post ED Visit - Positive Culture Follow-up  Culture report reviewed by antimicrobial stewardship pharmacist: [facility] [x]  Leonor Bash, [place].D. []  Venetia Gully, Pharm.D., BCPS AQ-ID []  Garrel Crews, Pharm.D., BCPS []  Almarie Lunger, Pharm.D., BCPS []  [place], [place].D., BCPS, AAHIVP []  Rosaline Bihari, Pharm.D., BCPS, AAHIVP []  Vernell Meier, PharmD, BCPS []  Latanya Hint, PharmD, BCPS []  Donald Medley, PharmD, BCPS []  Rocky Bold, PharmD []  Dorothyann Alert, PharmD, BCPS []  Morene Babe, PharmD  [facility] []  Rosaline Edison, PharmD []  Romona Bliss, PharmD []  Dolphus Roller, PharmD []  Veva Seip, Rph []  Vernell Daunt) Leonce, PharmD []  Eva Allis, PharmD []  Rosaline Millet, PharmD []  Iantha Batch, PharmD []  Arvin Gauss, PharmD []  Wanda Hasting, PharmD []  Ronal Rav, PharmD []  Rocky Slade, PharmD []  Bard Jeans, PharmD   Positive urine culture Treated with Cephalexin , organism sensitive to the same and no further patient follow-up is required at this time.  [place]ia Freeman 03/23/2024, 10:56 AM

## 2024-05-07 ENCOUNTER — Ambulatory Visit: Admitting: Obstetrics and Gynecology

## 2024-05-07 ENCOUNTER — Encounter: Payer: Self-pay | Admitting: Obstetrics and Gynecology

## 2024-05-07 VITALS — BP 123/86 | HR 76 | Ht 66.0 in | Wt 124.6 lb

## 2024-05-07 DIAGNOSIS — N809 Endometriosis, unspecified: Secondary | ICD-10-CM

## 2024-05-07 DIAGNOSIS — R102 Pelvic and perineal pain unspecified side: Secondary | ICD-10-CM | POA: Diagnosis not present

## 2024-05-07 DIAGNOSIS — F32A Depression, unspecified: Secondary | ICD-10-CM | POA: Diagnosis not present

## 2024-05-07 DIAGNOSIS — G8929 Other chronic pain: Secondary | ICD-10-CM

## 2024-05-07 MED ORDER — OXYCODONE-ACETAMINOPHEN 5-325 MG PO TABS: 1.0000 | ORAL_TABLET | Freq: Four times a day (QID) | ORAL | 0 refills | Status: AC | PRN

## 2024-05-07 NOTE — Progress Notes (Signed)
" ° °  GYNECOLOGY PROGRESS NOTE  History:  39 y.o. G2P2002 presents to [facility] [place] for chronic pelvic pain. She was diagnosed previously with endometriosis by laparoscopy. She has had 3-4 ablations. She has had removal of polyps from her ovaries. She has tried multiple birth controls in the past, but either didn't help or throwing it up. She has tried NSAIDs  previously. She has fibroids present. She reports significantly bloating, having hard time eating or only eating one meal d/t decreased appetite as she is not hungry or increase in bloating.  She also endorses pain during intercourse, at this time she desires hysterectomy given long standing history. She feels all of her symptoms are making mood worse.  She had a colonoscopy 2018 Last u/s 02/2024   Health Maintenance Due  Topic Date Due   COVID-19 Vaccine (1) Never done   HIV Screening  Never done   Hepatitis C Screening  Never done   DTaP/Tdap/Td (1 - Tdap) Never done   Hepatitis B Vaccines 19-59 Average Risk (1 of 3 - 19+ 3-dose series) Never done   Cervical Cancer Screening (HPV/Pap Cotest)  01/29/2018   Influenza Vaccine  11/25/2023     Review of Systems:  Pertinent items are noted in HPI.   Objective:  Physical Exam Blood pressure 123/86, pulse 76, height 5' 6 (1.676 m), weight 124 lb 9.6 oz (56.5 kg). VS reviewed, nursing note reviewed,  Constitutional: well developed, well nourished, no distress HEENT: normocephalic Pulm/chest wall: normal effort Breast Exam: deferred Abdomen: bloating noted lower abdomen  Neuro: alert and oriented  Skin: warm, dry Psych: affect normal Pelvic exam: deferred  Ultrasound Finding:   IMPRESSION: 1. Stable submucosal fundal cysts, possibly sequelae of prior endometrial ablation or adenomyosis; stable since 05/13/2023. 2. 16 mm subserosal anterior fundal uterine fibroid. 3. Small simple-appearing free fluid in the cul-de-sac.  Assessment & Plan:  1. Chronic pelvic pain in female  (Primary) 2. Endometriosis determined by laparoscopy Discussed follow up with Dr. Jeralyn for surgical consult and chronic pelvic pain, message sent to [facility] to get her scheduled  Had recent u/s 02/2024 She has a trip and is worried about pain, discussed will send her 3 pills percocet from ED to get her through trip but use very sparingly. Discussed other options for pain management . Sent message separately, discussed with MD, trial of orilissa, discussed potential side effect hot flash.  Can also trial baclofen in the meantime   3. Depression, unspecified depression type Referral placed  - [facility] ref to [facility]    Return consult hysterectomy, chronic pelvic pain.   Nidia Daring, FNP  "

## 2024-05-07 NOTE — Progress Notes (Signed)
 Pt scored 5 on PHQ; states pain in lower abdomin has made depression worse (scale 10 of 10).  Pt reports US  at ED for pain a week before Christmas; she was told spots on ovaries and fibroids covering uterus.   Hasn't had a period in 11 years. Pt reports multiple ablations and tubal ligation.    Pt states she had PAP last year; normal results.

## 2024-05-28 ENCOUNTER — Institutional Professional Consult (permissible substitution): Payer: Self-pay | Admitting: Obstetrics and Gynecology

## 2024-06-28 ENCOUNTER — Institutional Professional Consult (permissible substitution): Admitting: Obstetrics and Gynecology
